# Patient Record
Sex: Female | Born: 1970 | Race: White | Hispanic: No | Marital: Single | Smoking: Never smoker
Health system: Southern US, Community
[De-identification: ages and names within clinical notes are randomized; demographics above are authoritative.]

## PROBLEM LIST (undated history)

## (undated) DIAGNOSIS — Z87442 Personal history of urinary calculi: Secondary | ICD-10-CM

## (undated) DIAGNOSIS — F329 Major depressive disorder, single episode, unspecified: Secondary | ICD-10-CM

## (undated) DIAGNOSIS — R0600 Dyspnea, unspecified: Secondary | ICD-10-CM

## (undated) DIAGNOSIS — K589 Irritable bowel syndrome without diarrhea: Secondary | ICD-10-CM

## (undated) DIAGNOSIS — F32A Depression, unspecified: Secondary | ICD-10-CM

## (undated) DIAGNOSIS — Z8719 Personal history of other diseases of the digestive system: Secondary | ICD-10-CM

## (undated) DIAGNOSIS — E7800 Pure hypercholesterolemia, unspecified: Secondary | ICD-10-CM

## (undated) DIAGNOSIS — D649 Anemia, unspecified: Secondary | ICD-10-CM

## (undated) DIAGNOSIS — K219 Gastro-esophageal reflux disease without esophagitis: Secondary | ICD-10-CM

## (undated) DIAGNOSIS — I10 Essential (primary) hypertension: Secondary | ICD-10-CM

## (undated) DIAGNOSIS — F419 Anxiety disorder, unspecified: Secondary | ICD-10-CM

## (undated) DIAGNOSIS — K52832 Lymphocytic colitis: Secondary | ICD-10-CM

## (undated) DIAGNOSIS — R519 Headache, unspecified: Secondary | ICD-10-CM

## (undated) HISTORY — PX: CARPAL TUNNEL RELEASE: SHX101

## (undated) HISTORY — PX: WISDOM TOOTH EXTRACTION: SHX21

## (undated) HISTORY — PX: COLONOSCOPY: SHX174

---
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
## 2003-08-29 HISTORY — PX: HEMORRHOID SURGERY: SHX153

## 2009-10-25 ENCOUNTER — Ambulatory Visit (HOSPITAL_BASED_OUTPATIENT_CLINIC_OR_DEPARTMENT_OTHER): Admission: RE | Admit: 2009-10-25 | Discharge: 2009-10-25 | Payer: Self-pay | Admitting: Orthopedic Surgery

## 2010-07-05 ENCOUNTER — Emergency Department: Payer: Self-pay | Admitting: Emergency Medicine

## 2010-11-18 LAB — BASIC METABOLIC PANEL
BUN: 10 mg/dL (ref 6–23)
CO2: 28 mEq/L (ref 19–32)
Calcium: 9.5 mg/dL (ref 8.4–10.5)
Chloride: 99 mEq/L (ref 96–112)
Creatinine, Ser: 0.93 mg/dL (ref 0.4–1.2)
GFR calc Af Amer: >60 mL/min (ref >60)
Glucose, Bld: 100 mg/dL — ABNORMAL HIGH (ref 70–99)

## 2011-10-10 ENCOUNTER — Ambulatory Visit: Payer: Self-pay

## 2012-11-20 ENCOUNTER — Ambulatory Visit: Payer: Self-pay

## 2014-02-23 ENCOUNTER — Ambulatory Visit: Payer: Self-pay

## 2015-06-09 ENCOUNTER — Other Ambulatory Visit: Payer: Self-pay

## 2015-06-09 DIAGNOSIS — Z1231 Encounter for screening mammogram for malignant neoplasm of breast: Secondary | ICD-10-CM

## 2015-06-16 ENCOUNTER — Ambulatory Visit
Admission: RE | Admit: 2015-06-16 | Discharge: 2015-06-16 | Disposition: A | Discharge status: Home / Self Care | Payer: 59 | Source: Ambulatory Visit

## 2015-06-16 DIAGNOSIS — Z1231 Encounter for screening mammogram for malignant neoplasm of breast: Secondary | ICD-10-CM

## 2015-12-02 ENCOUNTER — Other Ambulatory Visit: Payer: Self-pay | Admitting: Internal Medicine

## 2015-12-02 DIAGNOSIS — R1011 Right upper quadrant pain: Secondary | ICD-10-CM

## 2015-12-09 ENCOUNTER — Ambulatory Visit
Admission: RE | Admit: 2015-12-09 | Discharge: 2015-12-09 | Disposition: A | Discharge status: Home / Self Care | Payer: 59 | Source: Ambulatory Visit | Attending: Internal Medicine | Admitting: Internal Medicine

## 2015-12-09 DIAGNOSIS — R1011 Right upper quadrant pain: Secondary | ICD-10-CM | POA: Insufficient documentation

## 2015-12-09 DIAGNOSIS — N200 Calculus of kidney: Secondary | ICD-10-CM | POA: Diagnosis not present

## 2016-06-19 ENCOUNTER — Other Ambulatory Visit: Payer: Self-pay | Admitting: Internal Medicine

## 2016-06-19 DIAGNOSIS — Z1231 Encounter for screening mammogram for malignant neoplasm of breast: Secondary | ICD-10-CM

## 2016-07-07 ENCOUNTER — Ambulatory Visit
Admission: RE | Admit: 2016-07-07 | Discharge: 2016-07-07 | Disposition: A | Discharge status: Home / Self Care | Payer: 59 | Source: Ambulatory Visit | Attending: Internal Medicine | Admitting: Internal Medicine

## 2016-07-07 DIAGNOSIS — Z1231 Encounter for screening mammogram for malignant neoplasm of breast: Secondary | ICD-10-CM | POA: Insufficient documentation

## 2017-06-03 ENCOUNTER — Emergency Department: Payer: 59

## 2017-06-03 ENCOUNTER — Encounter: Payer: Self-pay | Admitting: Radiology

## 2017-06-03 ENCOUNTER — Emergency Department
Admission: EM | Admit: 2017-06-03 | Discharge: 2017-06-03 | Disposition: A | Discharge status: Home / Self Care | Payer: 59 | Attending: Emergency Medicine | Admitting: Emergency Medicine

## 2017-06-03 DIAGNOSIS — Z79899 Other long term (current) drug therapy: Secondary | ICD-10-CM | POA: Diagnosis not present

## 2017-06-03 DIAGNOSIS — Z885 Allergy status to narcotic agent status: Secondary | ICD-10-CM | POA: Insufficient documentation

## 2017-06-03 DIAGNOSIS — Z880 Allergy status to penicillin: Secondary | ICD-10-CM | POA: Diagnosis not present

## 2017-06-03 DIAGNOSIS — R0781 Pleurodynia: Secondary | ICD-10-CM

## 2017-06-03 DIAGNOSIS — E876 Hypokalemia: Secondary | ICD-10-CM | POA: Insufficient documentation

## 2017-06-03 LAB — CBC WITH DIFFERENTIAL/PLATELET
Basophils Absolute: 0.1 10*3/uL (ref 0–0.1)
Basophils Relative: 1 %
Eosinophils Absolute: 0.3 10*3/uL (ref 0–0.7)
Eosinophils Relative: 3 %
HCT: 40.5 % (ref 35.0–47.0)
Hemoglobin: 13.5 g/dL (ref 12.0–16.0)
Lymphocytes Relative: 25 %
Lymphs Abs: 2.2 10*3/uL (ref 1.0–3.6)
MCH: 25.9 pg — ABNORMAL LOW (ref 26.0–34.0)
MCHC: 33.4 g/dL (ref 32.0–36.0)
MCV: 77.5 fL — ABNORMAL LOW (ref 80.0–100.0)
Monocytes Absolute: 0.5 10*3/uL (ref 0.2–0.9)
Monocytes Relative: 6 %
Neutro Abs: 5.7 10*3/uL (ref 1.4–6.5)
Neutrophils Relative %: 65 %
Platelets: 430 10*3/uL (ref 150–440)
RBC: 5.23 MIL/uL — ABNORMAL HIGH (ref 3.80–5.20)
RDW: 16.3 % — ABNORMAL HIGH (ref 11.5–14.5)
WBC: 8.8 10*3/uL (ref 3.6–11.0)

## 2017-06-03 LAB — COMPREHENSIVE METABOLIC PANEL
ALK PHOS: 65 U/L (ref 38–126)
ALT: 21 U/L (ref 14–54)
ANION GAP: 7 (ref 5–15)
AST: 19 U/L (ref 15–41)
Albumin: 3.8 g/dL (ref 3.5–5.0)
BUN: 14 mg/dL (ref 6–20)
CALCIUM: 8.9 mg/dL (ref 8.9–10.3)
CO2: 29 mmol/L (ref 22–32)
Chloride: 100 mmol/L — ABNORMAL LOW (ref 101–111)
Creatinine, Ser: 0.79 mg/dL (ref 0.44–1.00)
GFR calc non Af Amer: >60 mL/min (ref >60)
GLUCOSE: 120 mg/dL — AB (ref 65–99)
POTASSIUM: 2.9 mmol/L — AB (ref 3.5–5.1)
Sodium: 136 mmol/L (ref 135–145)
TOTAL PROTEIN: 7.7 g/dL (ref 6.5–8.1)
Total Bilirubin: 0.7 mg/dL (ref 0.3–1.2)

## 2017-06-03 LAB — TROPONIN I

## 2017-06-03 MED ORDER — IOPAMIDOL (ISOVUE-370) INJECTION 76%
75.0000 mL | Freq: Once | INTRAVENOUS | Status: AC | PRN
  Administered 2017-06-03: 75 mL via INTRAVENOUS

## 2017-06-03 MED ORDER — POTASSIUM CHLORIDE CRYS ER 20 MEQ PO TBCR
40.0000 meq | EXTENDED_RELEASE_TABLET | Freq: Once | ORAL | Status: AC
  Administered 2017-06-03: 40 meq via ORAL
  Filled 2017-06-03: qty 2

## 2017-06-03 NOTE — Discharge Instructions (Signed)
You evaluated for right-sided chest pain, and although no certain cause was found, your exam and evaluation are overall reassuring in the emergency department today.  Return to the emergency department immediately for any new or worsening pain, dizziness, passing out, nausea, sweats, or any other symptoms concerning to you.

## 2017-06-03 NOTE — ED Provider Notes (Signed)
[facility] Emergency Department Provider Note ____________________________________________   I have reviewed the triage vital signs and the triage nursing note.  HISTORY  Chief Complaint Abdominal Pain   Historian Patient  HPI Natalie Strickland is a 46 y.o. female taking omeprazole for gerd presents with right sided pleuritic chest pain for several days.  Some mild shortness of breath.  No fevers or hemoptysis.  Never had this before.  No focal leg swelling, occasionally has bila pitting edema.  Pos for family history of blood clots - no personal history of such.  No central chest discomfort.  A week or so ago awoke with gerd into mouth and was coughing, thought she may have aspirated and developed a pneumonia.  Pain 5/10 currently - declines pain medication now.  Overnight at times severe pain.  Denies abdominal source of pain.    History reviewed. No pertinent past medical history.  There are no active problems to display for this patient.   No past surgical history on file.  Prior to Admission medications   Medication Sig Start Date End Date Taking? Authorizing Provider  busPIRone (BUSPAR) 30 MG tablet Take 15 mg by mouth 2 (two) times daily. 12/19/16  Yes [provider]  furosemide (LASIX) 20 MG tablet Take 20-40 mg by mouth 2 (two) times daily. Alternate 20 and 40 mg daily. Take 20 mg on MWF. Take 40 mg on Sun, Tues, Thurs and Sat. 04/19/17  Yes [provider]  hydrochlorothiazide (HYDRODIURIL) 25 MG tablet Take 25 mg by mouth 2 (two) times daily. 11/01/16  Yes [provider]  omeprazole (PRILOSEC) 40 MG capsule Take 20 mg by mouth daily. 09/01/16 09/01/17 Yes [provider]  sertraline (ZOLOFT) 50 MG tablet Take 25 mg by mouth 2 (two) times daily. 11/23/16  Yes [provider]  simvastatin (ZOCOR) 20 MG tablet Take 20 mg by mouth at bedtime. 02/20/17 02/20/18 Yes [provider]  VITAMIN A PO Take 1  tablet by mouth daily.   Yes [provider]    Allergies  Allergen Reactions  . Codeine Rash  . Penicillin G Hives    Has patient had a PCN reaction causing immediate rash, facial/tongue/throat swelling, SOB or lightheadedness with hypotension: No Has patient had a PCN reaction causing severe rash involving mucus membranes or skin necrosis: No Has patient had a PCN reaction that required hospitalization: No Has patient had a PCN reaction occurring within the last 10 years: No If all of the above answers are "NO", then may proceed with Cephalosporin use.     Family History  Problem Relation Age of Onset  . Breast cancer Mother 39       x 2 times  . Ovarian cancer Mother   . Breast cancer Maternal Aunt   . Ovarian cancer Maternal Aunt   . Breast cancer Maternal Aunt     Social History Social History  Substance Use Topics  . Smoking status: Not on file  . Smokeless tobacco: Not on file  . Alcohol use Not on file    Review of Systems  Constitutional: Negative for fever. Eyes: Negative for visual changes. ENT: Negative for sore throat. Cardiovascular: Negative for palpitations. Respiratory: Positive for shortness of breath. Gastrointestinal: Negative for abdominal pain, vomiting and diarrhea. Genitourinary: Negative for dysuria. Musculoskeletal: Negative for back pain. Skin: Negative for rash. Neurological: Negative for headache.  ____________________________________________   PHYSICAL EXAM:  VITAL SIGNS: ED Triage Vitals  Enc Vitals Group  BP 06/03/17 0856 (!) 153/73     Pulse Rate 06/03/17 0856 80     Resp 06/03/17 0856 18     Temp 06/03/17 0856 98.1 F (36.7 C)     Temp Source 06/03/17 0856 Oral     SpO2 06/03/17 0856 97 %     Weight 06/03/17 0857 190 lb (86.2 kg)     Height 06/03/17 0857 5\' 5"  (1.651 m)     Head Circumference --      Peak Flow --      Pain Score 06/03/17 0853 5     Pain Loc --      Pain Edu? --      Excl. in [place]? --       Constitutional: Alert and oriented. Well appearing and in no distress. HEENT   Head: Normocephalic and atraumatic.      Eyes: Conjunctivae are normal. Pupils equal and round.       Ears:         Nose: No congestion/rhinnorhea.   Mouth/Throat: Mucous membranes are moist.   Neck: No stridor. Cardiovascular/Chest: Normal rate, regular rhythm.  No murmurs, rubs, or gallops. Respiratory: Normal respiratory effort without tachypnea nor retractions. Breath sounds are clear and equal bilaterally. No wheezes/rales/rhonchi. Gastrointestinal: Soft. No distention, no guarding, no rebound. Nontender.  No ruq tenderness.  Genitourinary/rectal:Deferred Musculoskeletal: Nontender with normal range of motion in all extremities. No joint effusions.  No lower extremity tenderness.  No edema. Neurologic:  Normal speech and language. No gross or focal neurologic deficits are appreciated. Skin:  Skin is warm, dry and intact. No rash noted. Psychiatric: Mood and affect are normal. Speech and behavior are normal. Patient exhibits appropriate insight and judgment.   ____________________________________________  LABS (pertinent positives/negatives) I, Lisa Roca, MD the attending physician have reviewed the labs noted below.  Labs Reviewed  COMPREHENSIVE METABOLIC PANEL - Abnormal; Notable for the following:       Result Value   Potassium 2.9 (*)    Chloride 100 (*)    Glucose, Bld 120 (*)    All other components within normal limits  CBC WITH DIFFERENTIAL/PLATELET - Abnormal; Notable for the following:    RBC 5.23 (*)    MCV 77.5 (*)    MCH 25.9 (*)    RDW 16.3 (*)    All other components within normal limits  TROPONIN I    ____________________________________________    EKG I, Lisa Roca, MD, the attending physician have personally viewed and interpreted all ECGs.  70 bpm. Normal sinus rhythm. Narrow QRS. Normal axis. Normal ST and T-wave. She does have S1, every 3,  flattened T-wave but not inverted. ____________________________________________  RADIOLOGY All Xrays were viewed by me.  Imaging interpreted by Radiologist, and I, Lisa Roca, MD the attending physician have reviewed the radiologist interpretation noted below.  Chest xray 2 view:  IMPRESSION: No active cardiopulmonary disease.  CT angio chest for pe:  IMPRESSION: 1. No evidence of a pulmonary embolism. 2. No acute findings. No findings to account for right lower chest pain. 3. Minor lower lobe subsegmental atelectasis.  __________________________________________  PROCEDURES  Procedure(s) performed: None  Critical Care performed: None  ____________________________________________  No current facility-administered medications on file prior to encounter.    No current outpatient prescriptions on file prior to encounter.    ____________________________________________  ED COURSE / ASSESSMENT AND PLAN  Pertinent labs & imaging results that were available during my care of the patient were reviewed by me and considered in  my medical decision making (see chart for details).   Right pleuritic chest pain, will eval for emergency causes. No skin rash.  No other symptoms that seem like ACS.  No abdominal pain, seems unlikely abdominal source such as biliary colic.  Will start with CXr, and if negative for source we discussed risk vs benefit of pursuing chest CT to r/o PE especially given family history.  CT chest reassuring overall.  Uncertain cause for symptoms, but reassuring evaluation.  Does not seem to be abdominal = no abd pain on history or exam and reassuring labs.  DIFFERENTIAL DIAGNOSIS: Differential includes, but is not limited to, viral syndrome, bronchitis including COPD exacerbation, pneumonia, reactive airway disease including asthma, CHF including exacerbation with or without pulmonary/interstitial edema, pneumothorax, ACS, thoracic trauma, and pulmonary  embolism.  CONSULTATIONS:   None   Patient / Family / Caregiver informed of clinical course, medical decision-making process, and agree with plan.   I discussed return precautions, follow-up instructions, and discharge instructions with patient and/or family.  Discharge Instructions : You evaluated for right-sided chest pain, and although no certain cause was found, your exam and evaluation are overall reassuring in the emergency department today.  Return to the emergency department immediately for any new or worsening pain, dizziness, passing out, nausea, sweats, or any other symptoms concerning to you.  ___________________________________________   FINAL CLINICAL IMPRESSION(S) / ED DIAGNOSES   Final diagnoses:  Hypokalemia  Pleuritic chest pain              Note: This dictation was prepared with Dragon dictation. Any transcriptional errors that result from this process are unintentional    Lisa Roca, MD 06/03/17 1204

## 2017-06-03 NOTE — ED Triage Notes (Signed)
Patient states that she has significant reflux disease. States that she woke from sleep with gastric contents in her mouth and nose. Since then, she has developed RUQ pain with difficulty taking a deep breath. States that "i think it got in my lungs and I may have pneumonia"  Symptoms for 2 weeks

## 2017-06-06 ENCOUNTER — Other Ambulatory Visit: Payer: Self-pay | Admitting: Internal Medicine

## 2017-06-06 DIAGNOSIS — Z1231 Encounter for screening mammogram for malignant neoplasm of breast: Secondary | ICD-10-CM

## 2017-07-09 ENCOUNTER — Ambulatory Visit
Admission: RE | Admit: 2017-07-09 | Discharge: 2017-07-09 | Disposition: A | Discharge status: Home / Self Care | Payer: 59 | Source: Ambulatory Visit | Attending: Internal Medicine | Admitting: Internal Medicine

## 2017-07-09 DIAGNOSIS — Z1231 Encounter for screening mammogram for malignant neoplasm of breast: Secondary | ICD-10-CM | POA: Diagnosis present

## 2017-09-26 ENCOUNTER — Other Ambulatory Visit: Payer: Self-pay | Admitting: Internal Medicine

## 2017-09-26 DIAGNOSIS — M7989 Other specified soft tissue disorders: Secondary | ICD-10-CM

## 2017-10-01 ENCOUNTER — Ambulatory Visit
Admission: RE | Admit: 2017-10-01 | Discharge: 2017-10-01 | Disposition: A | Discharge status: Home / Self Care | Payer: Managed Care, Other (non HMO) | Source: Ambulatory Visit | Attending: Internal Medicine | Admitting: Internal Medicine

## 2017-10-01 DIAGNOSIS — M7989 Other specified soft tissue disorders: Secondary | ICD-10-CM | POA: Diagnosis not present

## 2018-07-30 ENCOUNTER — Other Ambulatory Visit: Payer: Self-pay | Admitting: Internal Medicine

## 2018-07-30 DIAGNOSIS — Z1231 Encounter for screening mammogram for malignant neoplasm of breast: Secondary | ICD-10-CM

## 2018-09-17 ENCOUNTER — Ambulatory Visit
Admission: RE | Admit: 2018-09-17 | Discharge: 2018-09-17 | Disposition: A | Discharge status: Home / Self Care | Payer: Managed Care, Other (non HMO) | Source: Ambulatory Visit | Attending: Internal Medicine | Admitting: Internal Medicine

## 2018-09-17 DIAGNOSIS — Z1231 Encounter for screening mammogram for malignant neoplasm of breast: Secondary | ICD-10-CM | POA: Insufficient documentation

## 2019-03-13 ENCOUNTER — Other Ambulatory Visit: Payer: Self-pay | Admitting: Gastroenterology

## 2019-03-13 ENCOUNTER — Other Ambulatory Visit
Admission: RE | Admit: 2019-03-13 | Discharge: 2019-03-13 | Disposition: A | Discharge status: Home / Self Care | Payer: Managed Care, Other (non HMO) | Source: Ambulatory Visit | Attending: Gastroenterology | Admitting: Gastroenterology

## 2019-03-13 DIAGNOSIS — K30 Functional dyspepsia: Secondary | ICD-10-CM

## 2019-03-13 DIAGNOSIS — K219 Gastro-esophageal reflux disease without esophagitis: Secondary | ICD-10-CM

## 2019-03-13 DIAGNOSIS — K52832 Lymphocytic colitis: Secondary | ICD-10-CM | POA: Insufficient documentation

## 2019-03-13 DIAGNOSIS — R1013 Epigastric pain: Secondary | ICD-10-CM

## 2019-03-19 ENCOUNTER — Other Ambulatory Visit: Payer: Self-pay

## 2019-03-19 ENCOUNTER — Ambulatory Visit
Admission: RE | Admit: 2019-03-19 | Discharge: 2019-03-19 | Disposition: A | Discharge status: Home / Self Care | Payer: Managed Care, Other (non HMO) | Source: Ambulatory Visit | Attending: Gastroenterology | Admitting: Gastroenterology

## 2019-03-19 DIAGNOSIS — R1013 Epigastric pain: Secondary | ICD-10-CM | POA: Insufficient documentation

## 2019-03-19 DIAGNOSIS — K30 Functional dyspepsia: Secondary | ICD-10-CM | POA: Diagnosis present

## 2019-03-19 DIAGNOSIS — K219 Gastro-esophageal reflux disease without esophagitis: Secondary | ICD-10-CM

## 2019-03-19 LAB — CALPROTECTIN, FECAL: Calprotectin, Fecal: 81 ug/g (ref 0–120)

## 2019-04-04 ENCOUNTER — Other Ambulatory Visit: Payer: Self-pay | Admitting: Gastroenterology

## 2019-04-04 DIAGNOSIS — R1013 Epigastric pain: Secondary | ICD-10-CM

## 2019-04-11 ENCOUNTER — Other Ambulatory Visit: Payer: Self-pay

## 2019-04-11 ENCOUNTER — Ambulatory Visit
Admission: RE | Admit: 2019-04-11 | Discharge: 2019-04-11 | Disposition: A | Discharge status: Home / Self Care | Payer: Managed Care, Other (non HMO) | Source: Ambulatory Visit | Attending: Gastroenterology | Admitting: Gastroenterology

## 2019-04-11 DIAGNOSIS — R1013 Epigastric pain: Secondary | ICD-10-CM | POA: Insufficient documentation

## 2019-08-28 ENCOUNTER — Ambulatory Visit: Payer: Managed Care, Other (non HMO) | Attending: Internal Medicine

## 2019-08-28 DIAGNOSIS — Z20822 Contact with and (suspected) exposure to covid-19: Secondary | ICD-10-CM

## 2019-09-03 ENCOUNTER — Ambulatory Visit: Payer: Managed Care, Other (non HMO) | Attending: Internal Medicine

## 2019-09-03 DIAGNOSIS — Z20822 Contact with and (suspected) exposure to covid-19: Secondary | ICD-10-CM

## 2019-09-03 LAB — NOVEL CORONAVIRUS, NAA

## 2019-09-05 LAB — NOVEL CORONAVIRUS, NAA: SARS-CoV-2, NAA: NOT DETECTED

## 2019-09-16 ENCOUNTER — Other Ambulatory Visit: Payer: Self-pay | Admitting: Internal Medicine

## 2019-09-16 DIAGNOSIS — Z1231 Encounter for screening mammogram for malignant neoplasm of breast: Secondary | ICD-10-CM

## 2019-10-06 ENCOUNTER — Ambulatory Visit
Admission: RE | Admit: 2019-10-06 | Discharge: 2019-10-06 | Disposition: A | Discharge status: Home / Self Care | Payer: Managed Care, Other (non HMO) | Source: Ambulatory Visit | Attending: Internal Medicine | Admitting: Internal Medicine

## 2019-10-06 DIAGNOSIS — Z1231 Encounter for screening mammogram for malignant neoplasm of breast: Secondary | ICD-10-CM | POA: Insufficient documentation

## 2019-11-13 ENCOUNTER — Other Ambulatory Visit: Payer: Self-pay | Admitting: Obstetrics and Gynecology

## 2019-12-03 NOTE — H&P (Signed)
H&P will not copy - will need to be addressed at surgical visit

## 2019-12-06 NOTE — H&P (Signed)
History of Present Illness: Patient is an established patient who returns for a pre-operative/sign consents visit for a TLH With Right Salpingo Oophorectomy. Indications are her menorrhagia with irregular cycle, uterine fibroids, secondary anemia, and dermoid cyst of right ovary. CA125 within normal limits.  Work Up: Pap smear: 10/2015 neg/neg EMBx 09/2019: negative for hyperplasia and/or malignancy SIS, [place] 09/2019: Fibroid ut Ut=9.85 x 6.08 x 6.86 cm 1) post to endometrium= 19 mm 2) rt lat= 22 mm 3) lt fundal=26 mm Endometrium=12.83 mm Lt ov wnl Rt ov seen with complex mass Probable dermoid= avg 3.14 cm 3.46 x 3.01 x 2.96 cm  Pertinent Surgical Hx: -BTL  Past Medical History: has a past medical history of Anxiety, Hypertension, IBS (irritable bowel syndrome), and Lymphocytic colitis (11/17/2015).  Past Surgical History: has a past surgical history that includes Hemorrhoidectomy External; Tubal ligation; Colonoscopy (11/17/2015); other surgery; and egd (05/29/2019). Family History: family history includes Arthritis in her father; Breast cancer in her mother; Cancer in her father; Colon cancer in her maternal aunt and maternal grandfather; Colon polyps in her mother; Diabetes type II in her brother and father; GI problems in her mother; Heart disease in her father and mother; High blood pressure (Hypertension) in her brother, father, and mother; Hyperlipidemia (Elevated cholesterol) in her father and mother; Liver cancer in her maternal grandmother; Migraines in her mother; Myocardial Infarction (Heart attack) in her brother, father, and mother; Ovarian cancer in her mother; Thyroid disease in her brother and mother. Social History: reports that she has never smoked. She has never used smokeless tobacco. She reports current alcohol use. She reports that she does not use drugs. OB/GYN History:  OB History  Gravida  2  Para  2  Term  2  Preterm   AB   Living  2   SAB   TAB   Ectopic    Molar   Multiple   Live Births      Allergies: is allergic to codeine; nsaids (non-steroidal anti-inflammatory drug); and penicillin. Medications: Current Outpatient Medications:  . biotin 1,000 mcg Chew, Take by mouth., Disp: , Rfl:  . busPIRone (BUSPAR) 15 MG tablet, Take 1 tablet (15 mg total) by mouth 2 (two) times daily, Disp: 180 tablet, Rfl: 3 . cyanocobalamin (VITAMIN B12) 1000 MCG tablet, Take 1,000 mcg by mouth once daily, Disp: , Rfl:  . ferrous sulfate 325 (65 FE) MG tablet, Take 325 mg by mouth daily with breakfast, Disp: , Rfl:  . FUROsemide (LASIX) 20 MG tablet, Take 1 tablet by mouth once daily, Disp: 90 tablet, Rfl: 0 . hydroCHLOROthiazide (HYDRODIURIL) 25 MG tablet, Take 1 tablet by mouth twice daily, Disp: 60 tablet, Rfl: 0 . levocetirizine (XYZAL) 5 MG tablet, Take 1 tablet (5 mg total) by mouth every evening, Disp: 90 tablet, Rfl: 3 . lisinopriL (ZESTRIL) 20 MG tablet, Take 1 tablet by mouth once daily, Disp: 90 tablet, Rfl: 0 . omeprazole (PRILOSEC) 40 MG DR capsule, TAKE 1 CAPSULE BY MOUTH TWICE DAILY 15 TO 20 MINUTES BEFORE MEALS, Disp: 60 capsule, Rfl: 6 . PLANT STANOL ESTER (CHOLEST OFF ORAL), Take by mouth., Disp: , Rfl:  . potassium gluconate 595 mg (99 mg) Tab, Take 1 tablet by mouth once daily. , Disp: , Rfl:  . sertraline (ZOLOFT) 50 MG tablet, Take 1 tablet (50 mg total) by mouth once daily, Disp: 30 tablet, Rfl: 11 . simvastatin (ZOCOR) 20 MG tablet, Take 1 tablet (20 mg total) by mouth nightly, Disp: 90 tablet, Rfl: 3  Exam:  BP 138/82  Ht 165.1 cm (5\' 5" )  Wt 98.4 kg (217 lb)  BMI 36.11 kg/m   Constitutional: General appearance: Well nourished, well developed female in no acute distress.  Neuro/psych: Normal mood and affect. No gross motor deficits. Neck: Supple, normal appearance.  Respiratory: Normal respiratory effort, no use of accessory muscles Skin: No visible rashes or external lesions  Impression:   The primary encounter  diagnosis was Pre-operative clearance. Diagnoses of Menorrhagia with irregular cycle, Submucous leiomyoma of uterus, and Dermoid cyst of ovary, right were also pertinent to this visit.  Plan:   1. Pre-Operative Discussion Patient returns for a preoperative discussion regarding her plans to proceed with surgical treatment of her Menorrhagia with Irregular Cycle, Uterine Fibroids, Secondary Anemia, Dermoid cyst of Right Ovary by total laparoscopic hysterectomy with Right Salpingo Oophorectomy procedure. We may perform a cystoscopy to evaluate the urinary tract after the procedure, if surgically indicated for uro tract integrity.   The patient and I discussed the technical aspects of the procedure including the potential for risks and complications. These include but are not limited to the risk of infection requiring post-operative antibiotics or further procedures. We talked about the risk of injury to adjacent organs including bladder, bowel, ureter, blood vessels or nerves. We talked about the need to convert to an open incision. We talked about the possible need for blood transfusion. We talked about postop complications such as thromboembolic or cardiopulmonary complications. All of her questions were answered.   Hx of anemia, most recent H/H 08/2019 12.7/40.4. On oral iron  Specific Peri-operative Considerations:  - Consent: obtained today - Health Maintenance: will get CA125. No MRI because will be doing an oophorectomy, not just a cystectomy - Labs: CBC, CMP preoperatively - Studies: EKG, CXR preoperatively - Bowel Preparation: None required - Abx: Ancef 2g - VTE ppx: SCDs perioperatively - Glucose Protocol: None - Beta-blockade: n/a - Will give preop Lovenox  Diagnoses and all orders for this visit:  Pre-operative clearance  Menorrhagia with irregular cycle  Submucous leiomyoma of uterus  Dermoid cyst of ovary, right  Return for 2 week Post-Op.

## 2019-12-09 ENCOUNTER — Encounter
Admission: RE | Admit: 2019-12-09 | Discharge: 2019-12-09 | Disposition: A | Discharge status: Home / Self Care | Payer: Managed Care, Other (non HMO) | Source: Ambulatory Visit | Attending: Obstetrics and Gynecology | Admitting: Obstetrics and Gynecology

## 2019-12-09 ENCOUNTER — Other Ambulatory Visit: Payer: Self-pay

## 2019-12-09 DIAGNOSIS — Z01812 Encounter for preprocedural laboratory examination: Secondary | ICD-10-CM | POA: Diagnosis not present

## 2019-12-09 HISTORY — DX: Irritable bowel syndrome, unspecified: K58.9

## 2019-12-09 HISTORY — DX: Pure hypercholesterolemia, unspecified: E78.00

## 2019-12-09 HISTORY — DX: Headache, unspecified: R51.9

## 2019-12-09 HISTORY — DX: Anemia, unspecified: D64.9

## 2019-12-09 HISTORY — DX: Gastro-esophageal reflux disease without esophagitis: K21.9

## 2019-12-09 HISTORY — DX: Dyspnea, unspecified: R06.00

## 2019-12-09 HISTORY — DX: Essential (primary) hypertension: I10

## 2019-12-09 HISTORY — DX: Personal history of urinary calculi: Z87.442

## 2019-12-09 HISTORY — DX: Depression, unspecified: F32.A

## 2019-12-09 HISTORY — DX: Lymphocytic colitis: K52.832

## 2019-12-09 HISTORY — DX: Anxiety disorder, unspecified: F41.9

## 2019-12-09 HISTORY — DX: Personal history of other diseases of the digestive system: Z87.19

## 2019-12-09 NOTE — Patient Instructions (Addendum)
INSTRUCTIONS FOR SURGERY     Your surgery is scheduled for:   Monday, April 19TH     To find out your arrival time for the day of surgery,          please call [phone] between 1 pm and 3 pm on :  Friday, April 16TH     When you arrive for surgery, report to the [facility].       Do NOT stop on the first floor to register.    REMEMBER: Instructions that are not followed completely may result in serious medical risk,  up to and including death, or upon the discretion of your surgeon and anesthesiologist,            your surgery may need to be rescheduled.  __X__ 1. Do not eat food after midnight the night before your procedure.                    No gum, candy, lozenger, tic tacs, tums or hard candies.                  ABSOLUTELY NOTHING SOLID IN YOUR MOUTH AFTER MIDNIGHT                   You may drink unlimited clear liquids up to 2 hours before you are scheduled to arrive for surgery.                   Do not drink anything within those 2 hours unless you need to take medicine, then take the                   smallest amount you need.  Clear liquids include:  water, apple juice without pulp,                   any flavor Gatorade, Black coffee, black tea.  Sugar may be added but no dairy/ honey /lemon.                        Broth and jello is not considered a clear liquid.  __x__  2. On the morning of surgery, please brush your teeth with toothpaste and water. You may rinse with                  mouthwash if you wish but DO NOT SWALLOW TOOTHPASTE OR MOUTHWASH  __X___3. NO alcohol for 24 hours before or after surgery.  __x___ 4.  Do NOT smoke or use e-cigarettes for 24 HOURS PRIOR TO SURGERY.                      DO NOT Use any chewable tobacco products for at least 6 hours prior to surgery.  __x___ 5. If you start any new medication after this appointment and prior to surgery, please  Bring it with you on the day of surgery.  ___x__ 6. Notify your doctor if there is any change in your medical condition, such as fever,  infection, vomitting, diarrhea or any open sores.  __x___ 7.  USE the CHG SOAP as instructed, the night before surgery and the day of surgery.                   Once you have washed with this soap, do NOT use any of the following: Powders, perfumes                    or lotions. Please do not wear make up, hairpins, clips or nail polish. You MAY wear deodorant.                   Men may shave their face and neck.  Women need to shave 48 hours prior to surgery.                   DO NOT wear ANY jewelry on the day of surgery. If there are rings that are too tight to                    remove easily, please address this prior to the surgery day. Piercings need to be removed.                                                                     NO METAL ON YOUR BODY.                    Do NOT bring any valuables.  If you came to Pre-Admit testing then you will not need license,                     insurance card or credit card.  If you will be staying overnight, please either leave your things in                     the car or have your family be responsible for these items.                     [facility] IS NOT RESPONSIBLE FOR BELONGINGS OR VALUABLES.  ___X__ 8. DO NOT wear contact lenses on surgery day.  You may not have dentures,                     Hearing aides, contacts or glasses in the operating room. These items can be                    Placed in the Recovery Room to receive immediately after surgery.  __x___ 9. IF YOU ARE SCHEDULED TO GO HOME ON THE SAME DAY, YOU MUST                   Have someone to drive you home and to stay with you  for the first 24 hours.                    Have an arrangement prior to arriving on surgery day.  ___x__ 10. Take the following medications on the morning of surgery with a sip of water:  1.  BUSPAR                     2.  PRILOSEC                     3.                                 __X___ 11.  Follow any instructions provided to you by your surgeon.                        Such as enema, clear liquid bowel prep                       ##PLEASE CONSUME THE PRESURGICAL DRINK ON THE MORNING                         OF SURGERY.  HAVE IT COMPLETED BY 2 HOURS PRIOR TO                          ARRIVAL TO THE HOSPITAL.##  __X__  12. STOP ALL ASPIRIN PRODUCTS AS OF TODAY, MARCH 13TH                       THIS INCLUDES BC POWDERS / GOODIES POWDER  __x___ 13. STOP Anti-inflammatories as of: TODAY, MARCH 13TH                      This includes IBUPROFEN / MOTRIN / ADVIL / ALEVE/ NAPROXYN                    YOU MAY TAKE TYLENOL ANY TIME PRIOR TO SURGERY.  __X___ 52.  You may continue taking Vitamin B12 / Vitamin D3 but do not take on the morning of surgery.  ___X___17.  Continue to take the following medications but do not take on the morning of surgery:                          LASIX // HYDROCHLOROTHIAZIDE // POTASSIUM  ___X___18. Wear clean and comfortable clothing to the hospital.                    [place].  PLEASE BRING PHONE NUMBERS OF YOUR CONTACTS.  HAVE STOOL SOFTENERS FOR USE AFTER SURGERY. IT IS RECOMMENDED TO START    TAKING THESE A DAY OR 2 PRIOR TO YOUR SURGERY.  HAVE A FIRM PILLOW AVAILABLE TO USE FOR SPLINTING YOUR ABDOMEN AFTER SURGERY.

## 2019-12-11 ENCOUNTER — Other Ambulatory Visit
Admission: RE | Admit: 2019-12-11 | Discharge: 2019-12-11 | Disposition: A | Discharge status: Home / Self Care | Payer: Managed Care, Other (non HMO) | Source: Ambulatory Visit | Attending: Obstetrics and Gynecology | Admitting: Obstetrics and Gynecology

## 2019-12-11 ENCOUNTER — Other Ambulatory Visit: Payer: Self-pay

## 2019-12-11 DIAGNOSIS — Z20822 Contact with and (suspected) exposure to covid-19: Secondary | ICD-10-CM | POA: Insufficient documentation

## 2019-12-11 DIAGNOSIS — Z01812 Encounter for preprocedural laboratory examination: Secondary | ICD-10-CM | POA: Insufficient documentation

## 2019-12-11 DIAGNOSIS — I10 Essential (primary) hypertension: Secondary | ICD-10-CM

## 2019-12-11 LAB — BASIC METABOLIC PANEL
Anion gap: 11 (ref 5–15)
BUN: 17 mg/dL (ref 6–20)
CO2: 28 mmol/L (ref 22–32)
Calcium: 9 mg/dL (ref 8.9–10.3)
Chloride: 98 mmol/L (ref 98–111)
Creatinine, Ser: 0.92 mg/dL (ref 0.44–1.00)
GFR calc Af Amer: >60 mL/min (ref >60)
GFR calc non Af Amer: >60 mL/min (ref >60)
Glucose, Bld: 114 mg/dL — ABNORMAL HIGH (ref 70–99)
Potassium: 3.1 mmol/L — ABNORMAL LOW (ref 3.5–5.1)
Sodium: 137 mmol/L (ref 135–145)

## 2019-12-11 LAB — TYPE AND SCREEN
ABO/RH(D): O POS
Antibody Screen: NEGATIVE

## 2019-12-11 LAB — CBC
HCT: 38 % (ref 36.0–46.0)
Hemoglobin: 12.2 g/dL (ref 12.0–15.0)
MCH: 24.8 pg — ABNORMAL LOW (ref 26.0–34.0)
MCHC: 32.1 g/dL (ref 30.0–36.0)
MCV: 77.2 fL — ABNORMAL LOW (ref 80.0–100.0)
Platelets: 414 10*3/uL — ABNORMAL HIGH (ref 150–400)
RBC: 4.92 MIL/uL (ref 3.87–5.11)
RDW: 18.6 % — ABNORMAL HIGH (ref 11.5–15.5)
WBC: 9.5 10*3/uL (ref 4.0–10.5)
nRBC: 0 % (ref 0.0–0.2)

## 2019-12-11 LAB — SARS CORONAVIRUS 2 (TAT 6-24 HRS): SARS Coronavirus 2: NEGATIVE

## 2019-12-14 MED ORDER — CLINDAMYCIN PHOSPHATE 900 MG/50ML IV SOLN
900.0000 mg | INTRAVENOUS | Status: AC
  Administered 2019-12-15: 900 mg via INTRAVENOUS

## 2019-12-14 MED ORDER — GENTAMICIN SULFATE 40 MG/ML IJ SOLN
400.0000 mg | INTRAVENOUS | Status: AC
  Administered 2019-12-15: 400 mg via INTRAVENOUS
  Filled 2019-12-14: qty 10

## 2019-12-15 ENCOUNTER — Encounter
Admission: RE | Disposition: A | Discharge status: Home / Self Care | Payer: Self-pay | Source: Home / Self Care | Attending: Obstetrics and Gynecology

## 2019-12-15 ENCOUNTER — Ambulatory Visit: Payer: Managed Care, Other (non HMO) | Admitting: Registered Nurse

## 2019-12-15 ENCOUNTER — Ambulatory Visit
Admission: RE | Admit: 2019-12-15 | Discharge: 2019-12-15 | Disposition: A | Discharge status: Home / Self Care | Payer: Managed Care, Other (non HMO) | Attending: Obstetrics and Gynecology | Admitting: Obstetrics and Gynecology

## 2019-12-15 ENCOUNTER — Other Ambulatory Visit: Payer: Self-pay

## 2019-12-15 ENCOUNTER — Encounter: Payer: Self-pay | Admitting: Obstetrics and Gynecology

## 2019-12-15 DIAGNOSIS — N8301 Follicular cyst of right ovary: Secondary | ICD-10-CM | POA: Diagnosis not present

## 2019-12-15 DIAGNOSIS — D252 Subserosal leiomyoma of uterus: Secondary | ICD-10-CM | POA: Diagnosis not present

## 2019-12-15 DIAGNOSIS — N921 Excessive and frequent menstruation with irregular cycle: Secondary | ICD-10-CM | POA: Diagnosis present

## 2019-12-15 DIAGNOSIS — F419 Anxiety disorder, unspecified: Secondary | ICD-10-CM | POA: Diagnosis not present

## 2019-12-15 DIAGNOSIS — K589 Irritable bowel syndrome without diarrhea: Secondary | ICD-10-CM | POA: Insufficient documentation

## 2019-12-15 DIAGNOSIS — D270 Benign neoplasm of right ovary: Secondary | ICD-10-CM | POA: Diagnosis not present

## 2019-12-15 DIAGNOSIS — N72 Inflammatory disease of cervix uteri: Secondary | ICD-10-CM | POA: Insufficient documentation

## 2019-12-15 DIAGNOSIS — D250 Submucous leiomyoma of uterus: Secondary | ICD-10-CM | POA: Insufficient documentation

## 2019-12-15 DIAGNOSIS — N838 Other noninflammatory disorders of ovary, fallopian tube and broad ligament: Secondary | ICD-10-CM | POA: Diagnosis not present

## 2019-12-15 DIAGNOSIS — D649 Anemia, unspecified: Secondary | ICD-10-CM | POA: Diagnosis not present

## 2019-12-15 DIAGNOSIS — Z886 Allergy status to analgesic agent status: Secondary | ICD-10-CM | POA: Insufficient documentation

## 2019-12-15 DIAGNOSIS — Z885 Allergy status to narcotic agent status: Secondary | ICD-10-CM | POA: Insufficient documentation

## 2019-12-15 DIAGNOSIS — Z8249 Family history of ischemic heart disease and other diseases of the circulatory system: Secondary | ICD-10-CM | POA: Insufficient documentation

## 2019-12-15 DIAGNOSIS — Z880 Allergy status to penicillin: Secondary | ICD-10-CM | POA: Insufficient documentation

## 2019-12-15 DIAGNOSIS — D251 Intramural leiomyoma of uterus: Secondary | ICD-10-CM | POA: Diagnosis not present

## 2019-12-15 DIAGNOSIS — I10 Essential (primary) hypertension: Secondary | ICD-10-CM | POA: Diagnosis not present

## 2019-12-15 DIAGNOSIS — Z79899 Other long term (current) drug therapy: Secondary | ICD-10-CM | POA: Diagnosis not present

## 2019-12-15 HISTORY — PX: CYSTOSCOPY: SHX5120

## 2019-12-15 HISTORY — PX: LAPAROSCOPIC BILATERAL SALPINGECTOMY: SHX5889

## 2019-12-15 HISTORY — PX: LAPAROSCOPIC HYSTERECTOMY: SHX1926

## 2019-12-15 LAB — POCT I-STAT, CHEM 8
BUN: 15 mg/dL (ref 6–20)
Calcium, Ion: 1.07 mmol/L — ABNORMAL LOW (ref 1.15–1.40)
Chloride: 97 mmol/L — ABNORMAL LOW (ref 98–111)
Creatinine, Ser: 0.9 mg/dL (ref 0.44–1.00)
Glucose, Bld: 87 mg/dL (ref 70–99)
HCT: 40 % (ref 36.0–46.0)
Hemoglobin: 13.6 g/dL (ref 12.0–15.0)
Potassium: 3.2 mmol/L — ABNORMAL LOW (ref 3.5–5.1)
Sodium: 137 mmol/L (ref 135–145)
TCO2: 27 mmol/L (ref 22–32)

## 2019-12-15 LAB — POCT PREGNANCY, URINE: Preg Test, Ur: NEGATIVE

## 2019-12-15 SURGERY — HYSTERECTOMY, TOTAL, LAPAROSCOPIC
Anesthesia: General | Laterality: Right

## 2019-12-15 MED ORDER — ACETAMINOPHEN 500 MG PO TABS: 1000.0000 mg | ORAL_TABLET | ORAL | Status: AC

## 2019-12-15 MED ORDER — EPHEDRINE 5 MG/ML INJ
INTRAVENOUS | Status: AC
  Filled 2019-12-15: qty 10

## 2019-12-15 MED ORDER — LIDOCAINE HCL (PF) 2 % IJ SOLN
INTRAMUSCULAR | Status: AC
  Filled 2019-12-15: qty 5

## 2019-12-15 MED ORDER — ROCURONIUM BROMIDE 100 MG/10ML IV SOLN
INTRAVENOUS | Status: DC | PRN
  Administered 2019-12-15: 50 mg via INTRAVENOUS
  Administered 2019-12-15 (×2): 20 mg via INTRAVENOUS

## 2019-12-15 MED ORDER — LIDOCAINE HCL (CARDIAC) PF 100 MG/5ML IV SOSY
PREFILLED_SYRINGE | INTRAVENOUS | Status: DC | PRN
  Administered 2019-12-15: 100 mg via INTRAVENOUS

## 2019-12-15 MED ORDER — GABAPENTIN 800 MG PO TABS: 800.0000 mg | ORAL_TABLET | Freq: Every day | ORAL | 0 refills | Status: DC

## 2019-12-15 MED ORDER — DOCUSATE SODIUM 100 MG PO CAPS: 100.0000 mg | ORAL_CAPSULE | Freq: Two times a day (BID) | ORAL | 0 refills | Status: DC

## 2019-12-15 MED ORDER — LACTATED RINGERS IV SOLN: INTRAVENOUS | Status: DC

## 2019-12-15 MED ORDER — MIDAZOLAM HCL 2 MG/2ML IJ SOLN
INTRAMUSCULAR | Status: AC
  Filled 2019-12-15: qty 2

## 2019-12-15 MED ORDER — TRAMADOL HCL 50 MG PO TABS: 50.0000 mg | ORAL_TABLET | Freq: Four times a day (QID) | ORAL | Status: DC | PRN

## 2019-12-15 MED ORDER — TRAMADOL HCL 50 MG PO TABS: 50.0000 mg | ORAL_TABLET | Freq: Four times a day (QID) | ORAL | 0 refills | Status: DC | PRN

## 2019-12-15 MED ORDER — CLINDAMYCIN PHOSPHATE 900 MG/50ML IV SOLN
INTRAVENOUS | Status: AC
  Filled 2019-12-15: qty 50

## 2019-12-15 MED ORDER — PROPOFOL 10 MG/ML IV BOLUS
INTRAVENOUS | Status: DC | PRN
  Administered 2019-12-15: 150 mg via INTRAVENOUS

## 2019-12-15 MED ORDER — ROCURONIUM BROMIDE 10 MG/ML (PF) SYRINGE
PREFILLED_SYRINGE | INTRAVENOUS | Status: AC
  Filled 2019-12-15: qty 10

## 2019-12-15 MED ORDER — ACETAMINOPHEN 500 MG PO TABS: 1000.0000 mg | ORAL_TABLET | Freq: Four times a day (QID) | ORAL | 0 refills | Status: AC

## 2019-12-15 MED ORDER — FENTANYL CITRATE (PF) 100 MCG/2ML IJ SOLN
INTRAMUSCULAR | Status: AC
  Filled 2019-12-15: qty 2

## 2019-12-15 MED ORDER — ONDANSETRON 4 MG PO TBDP: 4.0000 mg | ORAL_TABLET | Freq: Four times a day (QID) | ORAL | 0 refills | Status: AC | PRN

## 2019-12-15 MED ORDER — FENTANYL CITRATE (PF) 100 MCG/2ML IJ SOLN: 25.0000 ug | INTRAMUSCULAR | Status: DC | PRN

## 2019-12-15 MED ORDER — ACETAMINOPHEN 500 MG PO TABS
ORAL_TABLET | ORAL | Status: AC
  Administered 2019-12-15: 1000 mg via ORAL
  Filled 2019-12-15: qty 2

## 2019-12-15 MED ORDER — DEXAMETHASONE SODIUM PHOSPHATE 10 MG/ML IJ SOLN
INTRAMUSCULAR | Status: AC
  Filled 2019-12-15: qty 1

## 2019-12-15 MED ORDER — PROPOFOL 10 MG/ML IV BOLUS
INTRAVENOUS | Status: AC
  Filled 2019-12-15: qty 20

## 2019-12-15 MED ORDER — PROPOFOL 500 MG/50ML IV EMUL
INTRAVENOUS | Status: DC | PRN
  Administered 2019-12-15: 20 ug/kg/min via INTRAVENOUS

## 2019-12-15 MED ORDER — GABAPENTIN 300 MG PO CAPS: 300.0000 mg | ORAL_CAPSULE | ORAL | Status: AC

## 2019-12-15 MED ORDER — MIDAZOLAM HCL 2 MG/2ML IJ SOLN
INTRAMUSCULAR | Status: DC | PRN
  Administered 2019-12-15: 2 mg via INTRAVENOUS

## 2019-12-15 MED ORDER — BUPIVACAINE HCL 0.5 % IJ SOLN
INTRAMUSCULAR | Status: DC | PRN
  Administered 2019-12-15: 9 mL

## 2019-12-15 MED ORDER — KETOROLAC TROMETHAMINE 30 MG/ML IJ SOLN
INTRAMUSCULAR | Status: DC | PRN
  Administered 2019-12-15: 30 mg via INTRAVENOUS

## 2019-12-15 MED ORDER — EPHEDRINE SULFATE 50 MG/ML IJ SOLN
INTRAMUSCULAR | Status: DC | PRN
  Administered 2019-12-15: 5 mg via INTRAVENOUS
  Administered 2019-12-15: 10 mg via INTRAVENOUS

## 2019-12-15 MED ORDER — KETOROLAC TROMETHAMINE 30 MG/ML IJ SOLN
INTRAMUSCULAR | Status: AC
  Filled 2019-12-15: qty 1

## 2019-12-15 MED ORDER — PHENYLEPHRINE HCL (PRESSORS) 10 MG/ML IV SOLN
INTRAVENOUS | Status: AC
  Filled 2019-12-15: qty 1

## 2019-12-15 MED ORDER — SUGAMMADEX SODIUM 200 MG/2ML IV SOLN
INTRAVENOUS | Status: DC | PRN
  Administered 2019-12-15: 200 mg via INTRAVENOUS

## 2019-12-15 MED ORDER — PROPOFOL 500 MG/50ML IV EMUL
INTRAVENOUS | Status: AC
  Filled 2019-12-15: qty 50

## 2019-12-15 MED ORDER — PROMETHAZINE HCL 25 MG/ML IJ SOLN: 6.2500 mg | INTRAMUSCULAR | Status: DC | PRN

## 2019-12-15 MED ORDER — ONDANSETRON HCL 4 MG/2ML IJ SOLN
INTRAMUSCULAR | Status: AC
  Filled 2019-12-15: qty 2

## 2019-12-15 MED ORDER — IBUPROFEN 800 MG PO TABS: 800.0000 mg | ORAL_TABLET | Freq: Three times a day (TID) | ORAL | 1 refills | Status: DC

## 2019-12-15 MED ORDER — BUPIVACAINE HCL (PF) 0.5 % IJ SOLN
INTRAMUSCULAR | Status: AC
  Filled 2019-12-15: qty 30

## 2019-12-15 MED ORDER — GABAPENTIN 300 MG PO CAPS
ORAL_CAPSULE | ORAL | Status: AC
  Administered 2019-12-15: 06:00:00 300 mg via ORAL
  Filled 2019-12-15: qty 1

## 2019-12-15 MED ORDER — SEVOFLURANE IN SOLN
RESPIRATORY_TRACT | Status: AC
  Filled 2019-12-15: qty 250

## 2019-12-15 MED ORDER — SUCCINYLCHOLINE CHLORIDE 200 MG/10ML IV SOSY
PREFILLED_SYRINGE | INTRAVENOUS | Status: AC
  Filled 2019-12-15: qty 10

## 2019-12-15 MED ORDER — OXYCODONE-ACETAMINOPHEN 5-325 MG PO TABS: 1.0000 | ORAL_TABLET | ORAL | Status: DC | PRN

## 2019-12-15 MED ORDER — PHENYLEPHRINE HCL (PRESSORS) 10 MG/ML IV SOLN
INTRAVENOUS | Status: DC | PRN
  Administered 2019-12-15: 150 ug via INTRAVENOUS
  Administered 2019-12-15 (×5): 100 ug via INTRAVENOUS

## 2019-12-15 MED ORDER — ONDANSETRON HCL 4 MG/2ML IJ SOLN
INTRAMUSCULAR | Status: DC | PRN
  Administered 2019-12-15: 4 mg via INTRAVENOUS

## 2019-12-15 MED ORDER — MEPERIDINE HCL 50 MG/ML IJ SOLN: 6.2500 mg | INTRAMUSCULAR | Status: DC | PRN

## 2019-12-15 MED ORDER — SODIUM CHLORIDE 0.9 % IV SOLN
INTRAVENOUS | Status: DC | PRN
  Administered 2019-12-15: 30 ug/min via INTRAVENOUS

## 2019-12-15 MED ORDER — PANTOPRAZOLE SODIUM 40 MG PO TBEC: 40.0000 mg | DELAYED_RELEASE_TABLET | Freq: Every day | ORAL | 1 refills | Status: DC

## 2019-12-15 MED ORDER — FENTANYL CITRATE (PF) 100 MCG/2ML IJ SOLN
INTRAMUSCULAR | Status: DC | PRN
  Administered 2019-12-15: 100 ug via INTRAVENOUS
  Administered 2019-12-15: 25 ug via INTRAVENOUS
  Administered 2019-12-15: 50 ug via INTRAVENOUS
  Administered 2019-12-15: 25 ug via INTRAVENOUS

## 2019-12-15 MED ORDER — TRAMADOL HCL 50 MG PO TABS
ORAL_TABLET | ORAL | Status: AC
  Administered 2019-12-15: 14:00:00 50 mg via ORAL
  Filled 2019-12-15: qty 1

## 2019-12-15 MED ORDER — OXYCODONE HCL 5 MG PO TABS: 5.0000 mg | ORAL_TABLET | ORAL | 0 refills | Status: DC | PRN

## 2019-12-15 MED ORDER — METOPROLOL TARTRATE 5 MG/5ML IV SOLN
INTRAVENOUS | Status: AC
  Filled 2019-12-15: qty 5

## 2019-12-15 MED ORDER — DEXAMETHASONE SODIUM PHOSPHATE 10 MG/ML IJ SOLN
INTRAMUSCULAR | Status: DC | PRN
  Administered 2019-12-15: 10 mg via INTRAVENOUS

## 2019-12-15 SURGICAL SUPPLY — 73 items
"PENCIL ELECTRO HAND CTR " (MISCELLANEOUS) IMPLANT
BAG URINE DRAIN 2000ML AR STRL (UROLOGICAL SUPPLIES) ×10 IMPLANT
BLADE SURG SZ11 CARB STEEL (BLADE) ×6 IMPLANT
CATH FOLEY 2WAY  5CC 16FR (CATHETERS) ×2
CATH URETL WHISTLE TIP 3FR (CATHETERS) ×2 IMPLANT
CATH URTH 16FR FL 2W BLN LF (CATHETERS) ×4 IMPLANT
CHLORAPREP W/TINT 26 (MISCELLANEOUS) ×6 IMPLANT
CLOSURE WOUND 1/4X4 (GAUZE/BANDAGES/DRESSINGS)
CORD MONOPOLAR M/FML 12FT (MISCELLANEOUS) ×4 IMPLANT
COUNTER NEEDLE 20/40 LG (NEEDLE) ×6 IMPLANT
COVER LIGHT HANDLE STERIS (MISCELLANEOUS) ×12 IMPLANT
COVER WAND RF STERILE (DRAPES) ×6 IMPLANT
DERMABOND ADVANCED (GAUZE/BANDAGES/DRESSINGS) ×2
DERMABOND ADVANCED .7 DNX12 (GAUZE/BANDAGES/DRESSINGS) ×4 IMPLANT
DEVICE SUTURE ENDOST 10MM (ENDOMECHANICALS) ×2 IMPLANT
DRAPE GENERAL ENDO 106X123.5 (DRAPES) ×4 IMPLANT
DRAPE STERI POUCH LG 24X46 STR (DRAPES) ×6 IMPLANT
DRAPE UNDER BUTTOCK W/FLU (DRAPES) ×6 IMPLANT
DRSG TEGADERM 2-3/8X2-3/4 SM (GAUZE/BANDAGES/DRESSINGS) ×12 IMPLANT
GLOVE BIO SURGEON STRL SZ7 (GLOVE) ×24 IMPLANT
GLOVE INDICATOR 7.5 STRL GRN (GLOVE) ×12 IMPLANT
GOWN STRL REUS W/ TWL LRG LVL3 (GOWN DISPOSABLE) ×8 IMPLANT
GOWN STRL REUS W/ TWL XL LVL3 (GOWN DISPOSABLE) ×4 IMPLANT
GOWN STRL REUS W/TWL LRG LVL3 (GOWN DISPOSABLE) ×8
GOWN STRL REUS W/TWL XL LVL3 (GOWN DISPOSABLE) ×2
GRASPER SUT TROCAR 14GX15 (MISCELLANEOUS) ×6 IMPLANT
IRRIGATION STRYKERFLOW (MISCELLANEOUS) ×4 IMPLANT
IRRIGATOR STRYKERFLOW (MISCELLANEOUS) ×6
IV NS 1000ML (IV SOLUTION) ×4
IV NS 1000ML BAXH (IV SOLUTION) ×4 IMPLANT
KIT PINK PAD W/HEAD ARE REST (MISCELLANEOUS) ×6
KIT PINK PAD W/HEAD ARM REST (MISCELLANEOUS) ×4 IMPLANT
KIT TURNOVER CYSTO (KITS) ×6 IMPLANT
L-HOOK LAP DISP 36CM (ELECTROSURGICAL) ×6
LABEL OR SOLS (LABEL) ×6 IMPLANT
LHOOK LAP DISP 36CM (ELECTROSURGICAL) IMPLANT
LIGASURE VESSEL 5MM BLUNT TIP (ELECTROSURGICAL) ×2 IMPLANT
MANIPULATOR VCARE LG CRV RETR (MISCELLANEOUS) IMPLANT
MANIPULATOR VCARE SML CRV RETR (MISCELLANEOUS) IMPLANT
MANIPULATOR VCARE STD CRV RETR (MISCELLANEOUS) ×2 IMPLANT
NDL FILTER BLUNT 18X1 1/2 (NEEDLE) ×4 IMPLANT
NEEDLE FILTER BLUNT 18X 1/2SAF (NEEDLE)
NEEDLE FILTER BLUNT 18X1 1/2 (NEEDLE) IMPLANT
NS IRRIG 500ML POUR BTL (IV SOLUTION) ×6 IMPLANT
OCCLUDER COLPOPNEUMO (BALLOONS) ×6 IMPLANT
PACK GYN LAPAROSCOPIC (MISCELLANEOUS) ×6 IMPLANT
PAD OB MATERNITY 4.3X12.25 (PERSONAL CARE ITEMS) ×6 IMPLANT
PAD PREP 24X41 OB/GYN DISP (PERSONAL CARE ITEMS) ×6 IMPLANT
PENCIL ELECTRO HAND CTR (MISCELLANEOUS) ×6 IMPLANT
POUCH SPECIMEN RETRIEVAL 10MM (ENDOMECHANICALS) IMPLANT
SCISSORS METZENBAUM CVD 33 (INSTRUMENTS) IMPLANT
SET CYSTO W/LG BORE CLAMP LF (SET/KITS/TRAYS/PACK) IMPLANT
SET TUBE SMOKE EVAC HIGH FLOW (TUBING) ×4 IMPLANT
SLEEVE ENDOPATH XCEL 5M (ENDOMECHANICALS) ×6 IMPLANT
SPONGE GAUZE 2X2 8PLY STER LF (GAUZE/BANDAGES/DRESSINGS)
SPONGE GAUZE 2X2 8PLY STRL LF (GAUZE/BANDAGES/DRESSINGS) ×8 IMPLANT
STRIP CLOSURE SKIN 1/4X4 (GAUZE/BANDAGES/DRESSINGS) ×4 IMPLANT
SUT ENDO VLOC 180-0-8IN (SUTURE) IMPLANT
SUT MNCRL 4-0 (SUTURE)
SUT MNCRL 4-0 27XMFL (SUTURE)
SUT MNCRL AB 4-0 PS2 18 (SUTURE) ×4 IMPLANT
SUT VIC AB 0 CT1 36 (SUTURE) ×12 IMPLANT
SUT VIC AB 2-0 UR6 27 (SUTURE) ×6 IMPLANT
SUT VIC AB 4-0 SH 27 (SUTURE) ×2
SUT VIC AB 4-0 SH 27XANBCTRL (SUTURE) ×4 IMPLANT
SUTURE MNCRL 4-0 27XMF (SUTURE) ×4 IMPLANT
SYR 10ML LL (SYRINGE) ×6 IMPLANT
SYR 50ML LL SCALE MARK (SYRINGE) ×4 IMPLANT
SYR 5ML LL (SYRINGE) ×4 IMPLANT
TROCAR ENDO BLADELESS 11MM (ENDOMECHANICALS) ×6 IMPLANT
TROCAR XCEL NON-BLD 5MMX100MML (ENDOMECHANICALS) ×6 IMPLANT
TUBING ART PRESS 48 MALE/FEM (TUBING) IMPLANT
TUBING EVAC SMOKE HEATED PNEUM (TUBING) ×6 IMPLANT

## 2019-12-15 NOTE — Interval H&P Note (Signed)
History and Physical Interval Note:  12/15/2019 7:28 AM  Natalie Strickland  has presented today for surgery, with the diagnosis of fibroids, AUB, dermoid in (R) ovary.  The various methods of treatment have been discussed with the patient and family. After consideration of risks, benefits and other options for treatment, the patient has consented to  Procedure(s) with comments: HYSTERECTOMY TOTAL LAPAROSCOPIC (N/A) - REQUESTING RNFA LAPAROSCOPIC BILATERAL SALPINGECTOMY (Bilateral) LAPAROSCOPIC OOPHORECTOMY (Right) as a surgical intervention.  The patient's history has been reviewed, patient examined, no change in status, stable for surgery.  I have reviewed the patient's chart and labs.  Questions were answered to the patient's satisfaction.     Benjaman Kindler

## 2019-12-15 NOTE — Op Note (Signed)
Natalie Strickland PROCEDURE DATE: 12/15/2019  PREOPERATIVE DIAGNOSIS: Fibroids, dermoid POSTOPERATIVE DIAGNOSIS: The same PROCEDURE:  HYSTERECTOMY TOTAL LAPAROSCOPIC:  LAPAROSCOPIC BILATERAL SALPINGECTOMY:  LAPAROSCOPIC OOPHORECTOMY:  CYSTOSCOPY:   SURGEON:  Dr. Benjaman Kindler ASSISTANT: RNFA, CNM Anesthesiologist:  Anesthesiologist: Molli Barrows, MD; Emmie Niemann, MD CRNA: Aline Brochure, CRNA; Hedda Slade, CRNA  INDICATIONS: 49 y.o. F  here for definitive surgical management secondary to the indications listed under preoperative diagnoses; please see preoperative note for further details.  Risks of surgery were discussed with the patient including but not limited to: bleeding which may require transfusion or reoperation; infection which may require antibiotics; injury to bowel, bladder, ureters or other surrounding organs; need for additional procedures; thromboembolic phenomenon, incisional problems and other postoperative/anesthesia complications. Written informed consent was obtained.    FINDINGS:  Enlarged boggy uterus, enlarged right ovary. Normal upper abdomen. Fibroids obscuring visualization with bleeding right uterine artery at a fibroid insertion.  ANESTHESIA:    General INTRAVENOUS FLUIDS:1500  ml ESTIMATED BLOOD LOSS:350 ml URINE OUTPUT: 400 ml   SPECIMENS: Uterus, cervix, bilateral fallopian tubes, right ovary COMPLICATIONS: None immediate  PROCEDURE IN DETAIL:  The patient received prophalactic intravenous antibiotics and had sequential compression devices applied to her lower extremities while in the preoperative area.  She was then taken to the operating room where general anesthesia was administered and was found to be adequate.  She was placed in the dorsal lithotomy position, and was prepped and draped in a sterile manner.  A formal time out was performed with all team members present and in agreement.  A V-care uterine manipulator was placed at this time.  A  Foley catheter was inserted into her bladder and attached to constant drainage. Attention was turned to the abdomen and 0.5% Marcaine infused subq. A 46mm umbilical incision was made with the scalpel.  The Optiview 5-mm trocar and sleeve were then advanced without difficulty with the laparoscope under direct visualization into the abdomen.  The abdomen was then insufflated with carbon dioxide gas and adequate pneumoperitoneum was obtained.  A survey of the patient's pelvis and abdomen revealed the findings above.  Bilateral lower quadrant ports (5 mm on the right and 11 mm on the left) were then placed under direct visualization.  The pelvis was then carefully examined.  Attention was turned to the fallopian tubes; these were freed from the underlying mesosalpinx and the uterine attachments using the Ligasure device on the left, and the ovarian vessels secured on the right, being careful of the ureter.  The bilateral round and broad ligaments were then clamped and transected with the Ligasure device.  The uterine artery was then skeletonized and a bladder flap was created.  The ureters were noted to be safely away from the area of dissection.  The bladder was then bluntly dissected off the lower uterine segment.    At this point, attention was turned to the uterine vessels, which were clamped and cauterized using the Ligasure on the left, and then the right. After the uterine blood flow at the level of the internal os was controlled, both arteries were cut with the Ligasure.  Good hemostasis was noted overall.  The uterosacral and cardinal ligaments were clamped, cut and ligated bilaterally .  Attention was then turned to the cervicovaginal junction, and monopolar bovie hook was used to transect the cervix from the surrounding vagina using the ring of the V-care as a guide. This was done circumferentially allowing total hysterectomy.  The uterus was then removed  from the vagina and the vaginal cuff incision was  then closed with running V-loc suture.  Overall excellent hemostasis was noted, except at the insertion of the left uterine artery, where cautery and the stitch was used to assure hemostasis. For this reason, a cystoscopy was performed and was normal. A 5mm whistle tip stent was placed temporarily to assure easy passage of the urine, and good efflux was finally noted from both ostia.  No stitches or injury were visualized in the bladder during cystoscopy.  Attention was returned to the abdomen.The ureters were reexamined bilaterally and were pulsating normally. The abdominal pressure was reduced and hemostasis was confirmed  The 74mm port fascia was closed with a vertical mattress with 0-Vicryl, using the cone closure system. All trocars were removed under direct visualization, and the abdomen was desufflated.  All skin incisions were closed with 4-0 monocryl subcuticular stitches and Dermabond. The patient tolerated the procedures well.  All instruments, needles, and sponge counts were correct x 2. The patient was taken to the recovery room awake, extubated and in stable condition.    ERAS, discharge today

## 2019-12-15 NOTE — Anesthesia Preprocedure Evaluation (Signed)
Anesthesia Evaluation  Patient identified by MRN, date of birth, ID band Patient awake    Reviewed: Allergy & Precautions, NPO status , Patient's Chart, lab work & pertinent test results  History of Anesthesia Complications Negative for: history of anesthetic complications  Airway Mallampati: II  TM Distance: >3 FB Neck ROM: Full    Dental  (+) Poor Dentition, Missing   Pulmonary neg sleep apnea, neg COPD,    breath sounds clear to auscultation- rhonchi (-) wheezing      Cardiovascular Exercise Tolerance: Good hypertension, Pt. on medications (-) CAD, (-) Past MI, (-) Cardiac Stents and (-) CABG  Rhythm:Regular Rate:Normal - Systolic murmurs and - Diastolic murmurs    Neuro/Psych  Headaches, neg Seizures PSYCHIATRIC DISORDERS Anxiety Depression    GI/Hepatic Neg liver ROS, hiatal hernia, GERD  ,  Endo/Other  negative endocrine ROSneg diabetes  Renal/GU negative Renal ROS     Musculoskeletal negative musculoskeletal ROS (+)   Abdominal (+) + obese,   Peds  Hematology  (+) anemia ,   Anesthesia Other Findings Past Medical History: No date: Anemia     Comment:  low iron and vitamin b levels No date: Anxiety No date: Depression No date: Dyspnea No date: GERD (gastroesophageal reflux disease) No date: Headache     Comment:  dt anemia No date: History of hiatal hernia No date: History of kidney stones No date: Hypercholesteremia No date: Hypertension No date: IBS (irritable bowel syndrome) No date: Lymphocytic colitis   Reproductive/Obstetrics                             Anesthesia Physical Anesthesia Plan  ASA: II  Anesthesia Plan: General   Post-op Pain Management:    Induction: Intravenous  PONV Risk Score and Plan: 2 and Ondansetron, Dexamethasone and Midazolam  Airway Management Planned: Oral ETT  Additional Equipment:   Intra-op Plan:   Post-operative Plan:  Extubation in OR  Informed Consent: I have reviewed the patients History and Physical, chart, labs and discussed the procedure including the risks, benefits and alternatives for the proposed anesthesia with the patient or authorized representative who has indicated his/her understanding and acceptance.     Dental advisory given  Plan Discussed with: CRNA and Anesthesiologist  Anesthesia Plan Comments:         Anesthesia Quick Evaluation

## 2019-12-15 NOTE — Discharge Instructions (Signed)
Discharge instructions after   total laparoscopic hysterectomy   For the next three days, if you can, take ibuprofen and acetaminophen on a schedule, every 8 hours. You can take them together or you can intersperse them, and take one every four hours. I know you have gastric upset as well, so avoid the ibuprofen if it causes GI irritation.  I also gave you gabapentin for nighttime, to help you sleep and also to control pain. Take gabapentin medicine at night for at least the next 3 nights. You also have a medicine similiar to a mild narcotic, tramadol, to take as needed if the above medicines don't help.  Postop constipation is a major cause of pain. Stay well hydrated, walk as you tolerate, and take over the counter senna as well as stool softeners if you need them.    Signs and Symptoms to Report Call our office at [phone] if you have any of the following.  . Fever over 100.4 degrees or higher . Severe stomach pain not relieved with pain medications . Bright red bleeding that's heavier than a period that does not slow with rest . To go the bathroom a lot (frequency), you can't hold your urine (urgency), or it hurts when you empty your bladder (urinate) . Chest pain . Shortness of breath . Pain in the calves of your legs . Severe nausea and vomiting not relieved with anti-nausea medications . Signs of infection around your wounds, such as redness, hot to touch, swelling, green/yellow drainage (like pus), bad smelling discharge . Any concerns  What You Can Expect after Surgery . You may see some pink tinged, bloody fluid and bruising around the wound. This is normal. . You may notice shoulder and neck pain. This is caused by the gas used during surgery to expand your abdomen so your surgeon could get to the uterus easier. . You may have a sore throat because of the tube in your mouth during general anesthesia. This will go away in 2 to 3 days. . You may have some stomach cramps. .  You may notice spotting on your panties. . You may have pain around the incision sites.   Activities after Your Discharge Follow these guidelines to help speed your recovery at home: . Do the coughing and deep breathing as you did in the hospital for 2 weeks. Use the small blue breathing device, called the incentive spirometer for 2 weeks. . Don't drive if you are in pain or taking narcotic pain medicine. You may drive when you can safely slam on the brakes, turn the wheel forcefully, and rotate your torso comfortably. This is typically 1-2 weeks. Practice in a parking lot or side street prior to attempting to drive regularly.  . Ask others to help with household chores for 4 weeks. . Do not lift anything heavier that 10 pounds for 4-6 weeks. This includes pets, children, and groceries. . Don't do strenuous activities, exercises, or sports like vacuuming, tennis, squash, etc. until your doctor says it is safe to do so. ---Maintain pelvic rest for 8 weeks. This means nothing in the vagina or rectum at all (no douching, tampons, intercourse) for 8 weeks.  . Walk as you feel able. Rest often since it may take two or three weeks for your energy level to return to normal.  . You may climb stairs . Avoid constipation:   -Eat fruits, vegetables, and whole grains. Eat small meals as your appetite will take time to return to normal.   -  Drink 6 to 8 glasses of water each day unless your doctor has told you to limit your fluids.   -Use a laxative or stool softener as needed if constipation becomes a problem. You may take Miralax, metamucil, Citrucil, Colace, Senekot, FiberCon, etc. If this does not relieve the constipation, try two tablespoons of Milk Of Magnesia every 8 hours until your bowels move.  . You may shower. Gently wash the wounds with a mild soap and water. Pat dry. . Do not get in a hot tub, swimming pool, etc. for 6 weeks. . Do not use lotions, oils, powders on the wounds. . Do not douche,  use tampons, or have sex until your doctor says it is okay. . Take your pain medicine when you need it. The medicine may not work as well if the pain is bad.  Take the medicines you were taking before surgery. Other medications you will need are pain medications and possibly constipation and nausea medications (Zofran).

## 2019-12-15 NOTE — OR Nursing (Signed)
Medication changed from oxycodone to tramadol per Dr. Leafy Ro. Pt given tramadol at this time, will monitor closely for any adverse reactions.

## 2019-12-15 NOTE — Transfer of Care (Signed)
Immediate Anesthesia Transfer of Care Note  Patient: Natalie Strickland  Procedure(s) Performed: HYSTERECTOMY TOTAL LAPAROSCOPIC (N/A ) LAPAROSCOPIC BILATERAL SALPINGECTOMY (Bilateral ) LAPAROSCOPIC OOPHORECTOMY (Right ) CYSTOSCOPY  Patient Location: PACU  Anesthesia Type:General  Level of Consciousness: sedated  Airway & Oxygen Therapy: Patient Spontanous Breathing and Patient connected to face mask oxygen  Post-op Assessment: Report given to RN and Post -op Vital signs reviewed and stable  Post vital signs: Reviewed and stable  Last Vitals:  Vitals Value Taken Time  BP 127/57 12/15/19 1125  Temp 36.6 C 12/15/19 1125  Pulse 78 12/15/19 1126  Resp 19 12/15/19 1126  SpO2 96 % 12/15/19 1126  Vitals shown include unvalidated device data.  Last Pain:  Vitals:   12/15/19 1125  TempSrc:   PainSc: 0-No pain         Complications: No apparent anesthesia complications

## 2019-12-15 NOTE — Anesthesia Procedure Notes (Signed)
Procedure Name: Intubation Date/Time: 12/15/2019 7:41 AM Performed by: Hedda Slade, CRNA Pre-anesthesia Checklist: Patient identified, Patient being monitored, Timeout performed, Emergency Drugs available and Suction available Patient Re-evaluated:Patient Re-evaluated prior to induction Oxygen Delivery Method: Circle system utilized Preoxygenation: Pre-oxygenation with 100% oxygen Induction Type: IV induction Ventilation: Mask ventilation without difficulty Laryngoscope Size: 3 and McGraph Grade View: Grade I Tube type: Oral Tube size: 7.0 mm Number of attempts: 1 Airway Equipment and Method: Stylet and Video-laryngoscopy Placement Confirmation: ETT inserted through vocal cords under direct vision,  positive ETCO2 and breath sounds checked- equal and bilateral Secured at: 21 cm Tube secured with: Tape Dental Injury: Teeth and Oropharynx as per pre-operative assessment

## 2019-12-15 NOTE — OR Nursing (Signed)
Pt states she has hives with codeine. PT can not take oxycodone for pain. Dr. Leafy Ro called and made aware

## 2019-12-15 NOTE — OR Nursing (Signed)
PT shows no signs of adverse reactions to tramadol, pt is more alert at this time. PT ambulatory and voided prior to discharge. Pt spouse verbalizes understanding of changes made in discharge and medications. This RN infomred spouse to return oxycodone rx to drug store when he picks up tramadol.

## 2019-12-17 LAB — SURGICAL PATHOLOGY

## 2019-12-19 NOTE — Anesthesia Postprocedure Evaluation (Signed)
Anesthesia Post Note  Patient: Natalie Strickland  Procedure(s) Performed: HYSTERECTOMY TOTAL LAPAROSCOPIC (N/A ) LAPAROSCOPIC BILATERAL SALPINGECTOMY (Bilateral ) LAPAROSCOPIC OOPHORECTOMY (Right ) CYSTOSCOPY  Patient location during evaluation: PACU Anesthesia Type: General Level of consciousness: awake and alert Pain management: pain level controlled Vital Signs Assessment: post-procedure vital signs reviewed and stable Respiratory status: spontaneous breathing, nonlabored ventilation, respiratory function stable and patient connected to nasal cannula oxygen Cardiovascular status: blood pressure returned to baseline and stable Postop Assessment: no apparent nausea or vomiting Anesthetic complications: no     Last Vitals:  Vitals:   12/15/19 1309 12/15/19 1427  BP: 129/67 119/64  Pulse: 73 75  Resp: 14 16  Temp: (!) 36.1 C   SpO2: 96% 97%    Last Pain:  Vitals:   12/16/19 0830  TempSrc:   PainSc: 2                  Molli Barrows

## 2020-03-31 ENCOUNTER — Encounter: Payer: Self-pay | Admitting: Emergency Medicine

## 2020-03-31 ENCOUNTER — Emergency Department: Payer: Managed Care, Other (non HMO)

## 2020-03-31 ENCOUNTER — Other Ambulatory Visit: Payer: Self-pay

## 2020-03-31 ENCOUNTER — Inpatient Hospital Stay
Admission: EM | Admit: 2020-03-31 | Discharge: 2020-04-02 | DRG: 177 | Disposition: A | Discharge status: Home / Self Care | Payer: Managed Care, Other (non HMO) | Attending: Internal Medicine | Admitting: Internal Medicine

## 2020-03-31 DIAGNOSIS — U071 COVID-19: Principal | ICD-10-CM

## 2020-03-31 DIAGNOSIS — E7800 Pure hypercholesterolemia, unspecified: Secondary | ICD-10-CM | POA: Diagnosis present

## 2020-03-31 DIAGNOSIS — K219 Gastro-esophageal reflux disease without esophagitis: Secondary | ICD-10-CM

## 2020-03-31 DIAGNOSIS — Z6833 Body mass index (BMI) 33.0-33.9, adult: Secondary | ICD-10-CM

## 2020-03-31 DIAGNOSIS — F329 Major depressive disorder, single episode, unspecified: Secondary | ICD-10-CM | POA: Diagnosis present

## 2020-03-31 DIAGNOSIS — E876 Hypokalemia: Secondary | ICD-10-CM | POA: Diagnosis present

## 2020-03-31 DIAGNOSIS — Z87442 Personal history of urinary calculi: Secondary | ICD-10-CM | POA: Diagnosis not present

## 2020-03-31 DIAGNOSIS — Z880 Allergy status to penicillin: Secondary | ICD-10-CM

## 2020-03-31 DIAGNOSIS — E669 Obesity, unspecified: Secondary | ICD-10-CM | POA: Diagnosis present

## 2020-03-31 DIAGNOSIS — Z885 Allergy status to narcotic agent status: Secondary | ICD-10-CM

## 2020-03-31 DIAGNOSIS — J1282 Pneumonia due to coronavirus disease 2019: Secondary | ICD-10-CM | POA: Diagnosis present

## 2020-03-31 DIAGNOSIS — Z888 Allergy status to other drugs, medicaments and biological substances status: Secondary | ICD-10-CM

## 2020-03-31 DIAGNOSIS — E785 Hyperlipidemia, unspecified: Secondary | ICD-10-CM

## 2020-03-31 DIAGNOSIS — F419 Anxiety disorder, unspecified: Secondary | ICD-10-CM | POA: Diagnosis present

## 2020-03-31 DIAGNOSIS — I10 Essential (primary) hypertension: Secondary | ICD-10-CM

## 2020-03-31 DIAGNOSIS — E860 Dehydration: Secondary | ICD-10-CM | POA: Diagnosis present

## 2020-03-31 DIAGNOSIS — Z8249 Family history of ischemic heart disease and other diseases of the circulatory system: Secondary | ICD-10-CM | POA: Diagnosis not present

## 2020-03-31 DIAGNOSIS — F32A Depression, unspecified: Secondary | ICD-10-CM | POA: Insufficient documentation

## 2020-03-31 DIAGNOSIS — Z79899 Other long term (current) drug therapy: Secondary | ICD-10-CM | POA: Diagnosis not present

## 2020-03-31 LAB — URINALYSIS, COMPLETE (UACMP) WITH MICROSCOPIC
Bacteria, UA: NONE SEEN
Bilirubin Urine: NEGATIVE
Glucose, UA: NEGATIVE mg/dL
Hgb urine dipstick: NEGATIVE
Ketones, ur: NEGATIVE mg/dL
Leukocytes,Ua: NEGATIVE
Nitrite: NEGATIVE
Protein, ur: 100 mg/dL — AB
Specific Gravity, Urine: 1.017 (ref 1.005–1.030)
WBC, UA: NONE SEEN WBC/hpf (ref 0–5)
pH: 7 (ref 5.0–8.0)

## 2020-03-31 LAB — CBC WITH DIFFERENTIAL/PLATELET
Abs Immature Granulocytes: 0.05 10*3/uL (ref 0.00–0.07)
Basophils Absolute: 0 10*3/uL (ref 0.0–0.1)
Basophils Relative: 0 %
Eosinophils Absolute: 0 10*3/uL (ref 0.0–0.5)
Eosinophils Relative: 0 %
HCT: 41.5 % (ref 36.0–46.0)
Hemoglobin: 14.2 g/dL (ref 12.0–15.0)
Immature Granulocytes: 1 %
Lymphocytes Relative: 27 %
Lymphs Abs: 1.8 10*3/uL (ref 0.7–4.0)
MCH: 25.9 pg — ABNORMAL LOW (ref 26.0–34.0)
MCHC: 34.2 g/dL (ref 30.0–36.0)
MCV: 75.6 fL — ABNORMAL LOW (ref 80.0–100.0)
Monocytes Absolute: 0.5 10*3/uL (ref 0.1–1.0)
Monocytes Relative: 8 %
Neutro Abs: 4.3 10*3/uL (ref 1.7–7.7)
Neutrophils Relative %: 64 %
Platelets: 280 10*3/uL (ref 150–400)
RBC: 5.49 MIL/uL — ABNORMAL HIGH (ref 3.87–5.11)
RDW: 15.9 % — ABNORMAL HIGH (ref 11.5–15.5)
WBC: 6.7 10*3/uL (ref 4.0–10.5)
nRBC: 0 % (ref 0.0–0.2)

## 2020-03-31 LAB — COMPREHENSIVE METABOLIC PANEL
ALT: 100 U/L — ABNORMAL HIGH (ref 0–44)
AST: 72 U/L — ABNORMAL HIGH (ref 15–41)
Albumin: 4 g/dL (ref 3.5–5.0)
Alkaline Phosphatase: 69 U/L (ref 38–126)
Anion gap: 13 (ref 5–15)
BUN: 12 mg/dL (ref 6–20)
CO2: 28 mmol/L (ref 22–32)
Calcium: 8.8 mg/dL — ABNORMAL LOW (ref 8.9–10.3)
Chloride: 91 mmol/L — ABNORMAL LOW (ref 98–111)
Creatinine, Ser: 0.87 mg/dL (ref 0.44–1.00)
GFR calc Af Amer: >60 mL/min (ref >60)
GFR calc non Af Amer: >60 mL/min (ref >60)
Glucose, Bld: 134 mg/dL — ABNORMAL HIGH (ref 70–99)
Potassium: 2.4 mmol/L — CL (ref 3.5–5.1)
Sodium: 132 mmol/L — ABNORMAL LOW (ref 135–145)
Total Bilirubin: 0.8 mg/dL (ref 0.3–1.2)
Total Protein: 7.8 g/dL (ref 6.5–8.1)

## 2020-03-31 LAB — MONONUCLEOSIS SCREEN: Mono Screen: NEGATIVE

## 2020-03-31 LAB — PROCALCITONIN: Procalcitonin: <0.1 ng/mL

## 2020-03-31 LAB — LIPASE, BLOOD: Lipase: 43 U/L (ref 11–51)

## 2020-03-31 LAB — HIV ANTIBODY (ROUTINE TESTING W REFLEX): HIV Screen 4th Generation wRfx: NONREACTIVE

## 2020-03-31 LAB — MAGNESIUM: Magnesium: 1.9 mg/dL (ref 1.7–2.4)

## 2020-03-31 LAB — FIBRIN DERIVATIVES D-DIMER (ARMC ONLY): Fibrin derivatives D-dimer (ARMC): 675.41 ng/mL (FEU) — ABNORMAL HIGH (ref 0.00–499.00)

## 2020-03-31 LAB — SARS CORONAVIRUS 2 BY RT PCR (HOSPITAL ORDER, PERFORMED IN ~~LOC~~ HOSPITAL LAB): SARS Coronavirus 2: POSITIVE — AB

## 2020-03-31 LAB — ABO/RH: ABO/RH(D): O POS

## 2020-03-31 LAB — C-REACTIVE PROTEIN: CRP: 1 mg/dL — ABNORMAL HIGH (ref <1.0)

## 2020-03-31 MED ORDER — SODIUM CHLORIDE 0.9 % IV BOLUS
1000.0000 mL | Freq: Once | INTRAVENOUS | Status: AC
  Administered 2020-03-31: 1000 mL via INTRAVENOUS

## 2020-03-31 MED ORDER — SODIUM CHLORIDE 0.9 % IV SOLN
100.0000 mg | Freq: Every day | INTRAVENOUS | Status: DC
  Administered 2020-04-01 – 2020-04-02 (×2): 100 mg via INTRAVENOUS
  Filled 2020-03-31 (×2): qty 100

## 2020-03-31 MED ORDER — POTASSIUM CHLORIDE 10 MEQ/100ML IV SOLN
10.0000 meq | INTRAVENOUS | Status: AC
  Administered 2020-03-31 (×2): 10 meq via INTRAVENOUS
  Filled 2020-03-31: qty 100

## 2020-03-31 MED ORDER — POTASSIUM CHLORIDE IN NACL 40-0.9 MEQ/L-% IV SOLN
INTRAVENOUS | Status: DC
  Filled 2020-03-31 (×5): qty 1000

## 2020-03-31 MED ORDER — ZINC SULFATE 220 (50 ZN) MG PO CAPS
220.0000 mg | ORAL_CAPSULE | Freq: Every day | ORAL | Status: DC
  Administered 2020-04-01 – 2020-04-02 (×2): 220 mg via ORAL
  Filled 2020-03-31 (×2): qty 1

## 2020-03-31 MED ORDER — SODIUM CHLORIDE 0.9 % IV SOLN
200.0000 mg | Freq: Once | INTRAVENOUS | Status: AC
  Administered 2020-03-31: 200 mg via INTRAVENOUS
  Filled 2020-03-31: qty 200

## 2020-03-31 MED ORDER — LEVOCETIRIZINE DIHYDROCHLORIDE 5 MG PO TABS: 5.0000 mg | ORAL_TABLET | Freq: Every day | ORAL | Status: DC

## 2020-03-31 MED ORDER — POTASSIUM CHLORIDE 10 MEQ/100ML IV SOLN
10.0000 meq | Freq: Once | INTRAVENOUS | Status: AC
  Administered 2020-03-31: 10 meq via INTRAVENOUS
  Filled 2020-03-31: qty 100

## 2020-03-31 MED ORDER — PANTOPRAZOLE SODIUM 40 MG IV SOLR
40.0000 mg | Freq: Two times a day (BID) | INTRAVENOUS | Status: DC
  Administered 2020-03-31 – 2020-04-02 (×5): 40 mg via INTRAVENOUS
  Filled 2020-03-31 (×5): qty 40

## 2020-03-31 MED ORDER — IOHEXOL 300 MG/ML  SOLN
100.0000 mL | Freq: Once | INTRAMUSCULAR | Status: AC | PRN
  Administered 2020-03-31: 100 mL via INTRAVENOUS

## 2020-03-31 MED ORDER — LORATADINE 10 MG PO TABS
10.0000 mg | ORAL_TABLET | Freq: Every day | ORAL | Status: DC
  Administered 2020-03-31 – 2020-04-01 (×2): 10 mg via ORAL
  Filled 2020-03-31 (×2): qty 1

## 2020-03-31 MED ORDER — LISINOPRIL 20 MG PO TABS
20.0000 mg | ORAL_TABLET | Freq: Every evening | ORAL | Status: DC
  Administered 2020-03-31 – 2020-04-01 (×2): 20 mg via ORAL
  Filled 2020-03-31 (×2): qty 1

## 2020-03-31 MED ORDER — ENOXAPARIN SODIUM 40 MG/0.4ML ~~LOC~~ SOLN
40.0000 mg | SUBCUTANEOUS | Status: DC
  Administered 2020-03-31 – 2020-04-01 (×2): 40 mg via SUBCUTANEOUS
  Filled 2020-03-31 (×2): qty 0.4

## 2020-03-31 MED ORDER — DEXAMETHASONE SODIUM PHOSPHATE 10 MG/ML IJ SOLN
6.0000 mg | INTRAMUSCULAR | Status: DC
  Administered 2020-03-31 – 2020-04-01 (×2): 6 mg via INTRAVENOUS
  Filled 2020-03-31 (×2): qty 1

## 2020-03-31 MED ORDER — ONDANSETRON HCL 4 MG/2ML IJ SOLN
4.0000 mg | Freq: Once | INTRAMUSCULAR | Status: AC
  Administered 2020-03-31: 4 mg via INTRAVENOUS
  Filled 2020-03-31: qty 2

## 2020-03-31 MED ORDER — ASCORBIC ACID 500 MG PO TABS
500.0000 mg | ORAL_TABLET | Freq: Two times a day (BID) | ORAL | Status: DC
  Administered 2020-03-31 – 2020-04-02 (×4): 500 mg via ORAL
  Filled 2020-03-31 (×4): qty 1

## 2020-03-31 MED ORDER — ALBUTEROL SULFATE HFA 108 (90 BASE) MCG/ACT IN AERS
2.0000 | INHALATION_SPRAY | Freq: Four times a day (QID) | RESPIRATORY_TRACT | Status: DC
  Administered 2020-03-31 – 2020-04-02 (×7): 2 via RESPIRATORY_TRACT
  Filled 2020-03-31 (×2): qty 6.7

## 2020-03-31 MED ORDER — BUSPIRONE HCL 15 MG PO TABS
30.0000 mg | ORAL_TABLET | Freq: Two times a day (BID) | ORAL | Status: DC
  Administered 2020-03-31 – 2020-04-02 (×4): 30 mg via ORAL
  Filled 2020-03-31 (×5): qty 2

## 2020-03-31 MED ORDER — SIMVASTATIN 20 MG PO TABS
20.0000 mg | ORAL_TABLET | Freq: Every evening | ORAL | Status: DC
  Administered 2020-03-31 – 2020-04-01 (×2): 20 mg via ORAL
  Filled 2020-03-31 (×2): qty 1

## 2020-03-31 MED ORDER — SERTRALINE HCL 50 MG PO TABS
50.0000 mg | ORAL_TABLET | Freq: Every evening | ORAL | Status: DC
  Administered 2020-03-31 – 2020-04-01 (×2): 50 mg via ORAL
  Filled 2020-03-31 (×2): qty 1

## 2020-03-31 NOTE — ED Triage Notes (Signed)
Patient ambulatory to triage with steady gait, without difficulty or distress noted;pt reports N/V/D x wk

## 2020-03-31 NOTE — ED Notes (Signed)
Pt provided with lunch.

## 2020-03-31 NOTE — Consult Note (Signed)
Remdesivir - Pharmacy Brief Note   O:  ALT: 100 CXR: "patchy ground-glass opacities in the right lung base" SpO2: 95-100% on RA   A/P:  03/31/20 SARS-CoV-2 PCR positive  Remdesivir 200 mg IVPB once followed by 100 mg IVPB daily x 4 days.   Benita Gutter 03/31/2020 1:08 PM

## 2020-03-31 NOTE — Plan of Care (Signed)
  Problem: Education: Goal: Knowledge of risk factors and measures for prevention of condition will improve Outcome: Progressing   Problem: Coping: Goal: Psychosocial and spiritual needs will be supported Outcome: Progressing   Problem: Respiratory: Goal: Will maintain a patent airway Outcome: Progressing Goal: Complications related to the disease process, condition or treatment will be avoided or minimized Outcome: Progressing   

## 2020-03-31 NOTE — ED Notes (Signed)
Pt transport placed by unit secretary, Merton Border.

## 2020-03-31 NOTE — ED Notes (Signed)
Pt to CT

## 2020-03-31 NOTE — ED Notes (Signed)
Pt walking to toilet independently with a steady gait.

## 2020-03-31 NOTE — ED Notes (Signed)
Pharmacy notified of med verification and missing meds.

## 2020-03-31 NOTE — ED Provider Notes (Signed)
[facility] Emergency Department Provider Note   ____________________________________________   First MD Initiated Contact with Patient 03/31/20 1012     (approximate)  I have reviewed the triage vital signs and the nursing notes.   HISTORY  Chief Complaint Emesis    HPI Natalie Strickland is a 49 y.o. female has been feeling ill for about a week's time now, she started with what she describes "allergies" with sinus drainage.  She got vaccinated roughly Wednesday of last week receiving her first COVID-19 vaccine and then continued to have worsening symptoms including nausea vomiting loose stools and progressive increasing weakness and feeling dehydrated with dark urine.  No chest pain.  Mild feeling of shortness of breath.  Dry cough.  States associated upper abdominal pain with lots of nausea.  Feels rundown dehydrated     Past Medical History:  Diagnosis Date  . Anemia    low iron and vitamin b levels  . Anxiety   . Depression   . Dyspnea   . GERD (gastroesophageal reflux disease)   . Headache    dt anemia  . History of hiatal hernia   . History of kidney stones   . Hypercholesteremia   . Hypertension   . IBS (irritable bowel syndrome)   . Lymphocytic colitis     Patient Active Problem List   Diagnosis Date Noted  . Hypokalemia 03/31/2020    Past Surgical History:  Procedure Laterality Date  . CARPAL TUNNEL RELEASE Right   . COLONOSCOPY    . CYSTOSCOPY  12/15/2019   Procedure: CYSTOSCOPY;  Surgeon: Benjaman Kindler, MD;  Location: [facility] ORS;  Service: Gynecology;;  . [place]  2005  . LAPAROSCOPIC BILATERAL SALPINGECTOMY Bilateral 12/15/2019   Procedure: LAPAROSCOPIC BILATERAL SALPINGECTOMY;  Surgeon: Benjaman Kindler, MD;  Location: [facility] ORS;  Service: Gynecology;  Laterality: Bilateral;  . LAPAROSCOPIC HYSTERECTOMY N/A 12/15/2019   Procedure: HYSTERECTOMY TOTAL LAPAROSCOPIC;  Surgeon: Benjaman Kindler, MD;  Location: [facility] ORS;   Service: Gynecology;  Laterality: N/A;  REQUESTING RNFA  . TUBAL LIGATION  1990  . WISDOM TOOTH EXTRACTION      Prior to Admission medications   Medication Sig Start Date End Date Taking? Authorizing Provider  busPIRone (BUSPAR) 15 MG tablet Take 30 mg by mouth daily. 11/08/19   [provider]  Cholecalciferol (VITAMIN D3) 50 MCG (2000 UT) TABS Take 2,000 Units by mouth at bedtime.    [provider]  docusate sodium (COLACE) 100 MG capsule Take 1 capsule (100 mg total) by mouth 2 (two) times daily. To keep stools soft 12/15/19   Benjaman Kindler, MD  ferrous sulfate 325 (65 FE) MG tablet Take 325 mg by mouth daily.    [provider]  furosemide (LASIX) 20 MG tablet Take 20-40 mg by mouth See admin instructions. Take 1 tablet (20 mg) by mouth every morning & take 1 tablet (20 mg) by mouth every other day in the evening. 04/19/17   [provider]  gabapentin (NEURONTIN) 800 MG tablet Take 1 tablet (800 mg total) by mouth at bedtime for 14 days. Take nightly for 3 days, then up to 14 days as needed 12/15/19 12/29/19  Benjaman Kindler, MD  hydrochlorothiazide (HYDRODIURIL) 25 MG tablet Take 25 mg by mouth 2 (two) times daily. 11/01/16   [provider]  levocetirizine (XYZAL) 5 MG tablet Take 5 mg by mouth at bedtime.  11/11/19   [provider]  lisinopril (ZESTRIL) 20 MG tablet Take 20 mg by  mouth every evening.  10/11/19   [provider]  omeprazole (PRILOSEC) 40 MG capsule Take 40 mg by mouth in the morning and at bedtime.  09/01/16 12/01/21  [provider]  ondansetron (ZOFRAN ODT) 4 MG disintegrating tablet Take 1 tablet (4 mg total) by mouth every 6 (six) hours as needed for nausea. 12/15/19   Benjaman Kindler, MD  oxyCODONE (OXY IR/ROXICODONE) 5 MG immediate release tablet Take 1 tablet (5 mg total) by mouth every 4 (four) hours as needed for severe pain. 12/15/19   Benjaman Kindler, MD  pantoprazole (PROTONIX) 40 MG tablet Take 1  tablet (40 mg total) by mouth daily. While on tramadol pain meds 12/15/19 12/14/20  Benjaman Kindler, MD  Potassium 99 MG TABS Take 99 mg by mouth daily.    [provider]  pyridOXINE (VITAMIN B-6) 100 MG tablet Take 100 mg by mouth at bedtime.    [provider]  sertraline (ZOLOFT) 50 MG tablet Take 50 mg by mouth every evening.  11/23/16   [provider]  simvastatin (ZOCOR) 20 MG tablet Take 20 mg by mouth every evening.  02/20/17 12/01/20  [provider]  traMADol (ULTRAM) 50 MG tablet Take 1 tablet (50 mg total) by mouth every 6 (six) hours as needed. 12/15/19 12/14/20  Benjaman Kindler, MD  vitamin B-12 (CYANOCOBALAMIN) 1000 MCG tablet Take 1,000 mcg by mouth daily.    [provider]    Allergies Anti-inflammatory enzyme [nutritional supplements], Codeine, and Penicillin g  Family History  Problem Relation Age of Onset  . Breast cancer Mother 77       x 2 times  . Ovarian cancer Mother   . Breast cancer Maternal Aunt   . Ovarian cancer Maternal Aunt   . Breast cancer Maternal Aunt     Social History Social History   Tobacco Use  . Smoking status: Never Smoker  . Smokeless tobacco: Never Used  Vaping Use  . Vaping Use: Never used  Substance Use Topics  . Alcohol use: Never  . Drug use: Never    Review of Systems Constitutional: Fevers chills fatigue and aches Eyes: No visual changes. ENT: No sore throat.  Runny nose Cardiovascular: Denies chest pain. Respiratory: Mild feeling shortness of breath Gastrointestinal: See HPI Genitourinary: Negative for dysuria.  Urine has been darker than normal Musculoskeletal: Negative for back pain. Skin: Negative for rash. Neurological: Negative for areas of focal weakness or numbness.    ____________________________________________   PHYSICAL EXAM:  VITAL SIGNS: ED Triage Vitals  Enc Vitals Group     BP 03/31/20 0354 (!) 102/58     Pulse Rate 03/31/20 0354 85     Resp 03/31/20  0354 18     Temp 03/31/20 0354 97.8 F (36.6 C)     Temp Source 03/31/20 0354 Oral     SpO2 03/31/20 0354 98 %     Weight 03/31/20 0352 200 lb (90.7 kg)     Height 03/31/20 0352 5\' 5"  (1.651 m)     Head Circumference --      Peak Flow --      Pain Score 03/31/20 0352 8     Pain Loc --      Pain Edu? --      Excl. in [place]? --     Constitutional: Alert and oriented.  Mildly ill in appearance, fatigued nauseated holding emesis basin but not actively vomiting Eyes: Conjunctivae are normal. Head: Atraumatic. Nose: No congestion/rhinnorhea. Mouth/Throat: Mucous membranes are moist.  Neck: No stridor.  Cardiovascular: Normal rate, regular rhythm. Grossly normal heart sounds.  Good peripheral circulation. Respiratory: Normal respiratory effort.  No retractions. Lungs CTAB. Gastrointestinal: Soft and nontender except in the epigastrium where she reports moderate tenderness to palpation. No distention. Musculoskeletal: No lower extremity tenderness nor edema. Neurologic:  Normal speech and language. No gross focal neurologic deficits are appreciated.  Skin:  Skin is warm, dry and intact. No rash noted. Psychiatric: Mood and affect are normal. Speech and behavior are normal.  ____________________________________________   LABS (all labs ordered are listed, but only abnormal results are displayed)  Labs Reviewed  SARS CORONAVIRUS 2 BY RT PCR ([place], [place] [facility]) - Abnormal; Notable for the following components:      Result Value   SARS Coronavirus 2 POSITIVE (*)    All other components within normal limits  CBC WITH DIFFERENTIAL/PLATELET - Abnormal; Notable for the following components:   RBC 5.49 (*)    MCV 75.6 (*)    MCH 25.9 (*)    RDW 15.9 (*)    All other components within normal limits  COMPREHENSIVE METABOLIC PANEL - Abnormal; Notable for the following components:   Sodium 132 (*)    Potassium 2.4 (*)    Chloride 91 (*)    Glucose, Bld 134  (*)    Calcium 8.8 (*)    AST 72 (*)    ALT 100 (*)    All other components within normal limits  URINALYSIS, COMPLETE (UACMP) WITH MICROSCOPIC - Abnormal; Notable for the following components:   Color, Urine YELLOW (*)    APPearance HAZY (*)    Protein, ur 100 (*)    All other components within normal limits  LIPASE, BLOOD  MAGNESIUM  MONONUCLEOSIS SCREEN   ____________________________________________  EKG  Reviewed inter by me at 1055 Heart rate 80 QRS 120 QTc 500 Normal sinus rhythm, minimal T wave flattening, slight artifact.  No evidence of acute ischemia denoted ____________________________________________  RADIOLOGY  CT ABDOMEN PELVIS W CONTRAST  Result Date: 03/31/2020 CLINICAL DATA:  Right upper quadrant pain for several days EXAM: CT ABDOMEN AND PELVIS WITH CONTRAST TECHNIQUE: Multidetector CT imaging of the abdomen and pelvis was performed using the standard protocol following bolus administration of intravenous contrast. CONTRAST:  167mL OMNIPAQUE IOHEXOL 300 MG/ML  SOLN COMPARISON:  None. FINDINGS: Lower chest: Lung bases demonstrate some patchy ground-glass opacities in the right middle and lower lobes correlate with current COVID-19 testing. Hepatobiliary: Fatty infiltration of the liver is noted. The gallbladder is within normal limits. Pancreas: Unremarkable. No pancreatic ductal dilatation or surrounding inflammatory changes. Spleen: Normal in size without focal abnormality. Adrenals/Urinary Tract: Adrenal glands are within normal limits. Kidneys demonstrate a normal enhancement pattern bilaterally. No renal calculi are noted. Mild hypertrophy of the right kidney is seen. Scarring in the left kidney is noted. The bladder is partially distended. Stomach/Bowel: Appendix is within normal limits. Colon shows no obstructive or inflammatory changes. Small bowel and stomach are unremarkable. Vascular/Lymphatic: Aortic atherosclerosis. No enlarged abdominal or pelvic lymph  nodes. Reproductive: Uterus has been surgically removed. Mildly prominent left ovary is noted with a cyst within measuring 2 cm Other: No abdominal wall hernia or abnormality. No abdominopelvic ascites. Musculoskeletal: No acute or significant osseous findings. IMPRESSION: Patchy ground-glass opacities in the right lung base. Correlate with pending COVID-19 testing. Fatty liver. Scarring in the left kidney with mild right renal hypertrophy. No obstructive changes are noted. Left ovarian cyst measuring 2 cm. Given  the patient's age this is likely physiologic in nature. Electronically Signed   By: Inez Catalina M.D.   On: 03/31/2020 11:31    CT reviewed, concerning for groundglass opacities right lung base.  Fatty liver. ____________________________________________   PROCEDURES  Procedure(s) performed: None  Procedures  Critical Care performed: No  ____________________________________________   INITIAL IMPRESSION / ASSESSMENT AND PLAN / ED COURSE  Pertinent labs & imaging results that were available during my care of the patient were reviewed by me and considered in my medical decision making (see chart for details).   Patient tested positive for COVID-19.  Given her clinical symptomatology and history I suspect this is likely etiology for her presentation symptoms today.  EKG demonstrates mild prolongation of QT, after repleting potassium will give a dose of Zofran.  IV fluids.  Patient will need rehydration, potassium repletion given the severity of her hypokalemia and mild prolongation of her QT interval will admit to the hospital for further treatment.  Discussed case with Dr. Bobbye Charleston, [facility] [place].  Patient is understanding and agreeable with plan.  Her husband is fully vaccinated, she does live with 2 other people who are not vaccinated and has had close contact with her, encouraged her to notify close contact so they may reach out to the health department for further recommendations.         ____________________________________________   FINAL CLINICAL IMPRESSION(S) / ED DIAGNOSES  Final diagnoses:  COVID-19  Hypokalemia        Note:  This document was prepared using Dragon voice recognition software and may include unintentional dictation errors       Delman Kitten, MD 03/31/20 1238

## 2020-03-31 NOTE — H&P (Signed)
[facility] NAME: Natalie Strickland    MR#:  [id number]  DATE OF BIRTH:  1971/04/13  DATE OF ADMISSION:  03/31/2020  PRIMARY CARE PHYSICIAN: Idelle Crouch, MD   REQUESTING/REFERRING PHYSICIAN: Dr Delman Kitten  CHIEF COMPLAINT:   Chief Complaint  Patient presents with  . Emesis    HISTORY OF PRESENT ILLNESS:  Natalie Strickland  is a 49 y.o. female coming in with nausea vomiting and diarrhea and unable to keep anything down for a few days.  Patient does have a cough and shortness of breath mostly at night.  Bringing up yellow sputum.  Some soreness in her joints.  No runny nose or postnasal drip.  She has had some fever and chills at home.  The patient has received her first Covid vaccination last week.  In the ER she was found to be Covid positive and have severe hypokalemia.  Hospitalist services were contacted for further evaluation.  PAST MEDICAL HISTORY:   Past Medical History:  Diagnosis Date  . Anemia    low iron and vitamin b levels  . Anxiety   . Depression   . Dyspnea   . GERD (gastroesophageal reflux disease)   . Headache    dt anemia  . History of hiatal hernia   . History of kidney stones   . Hypercholesteremia   . Hypertension   . IBS (irritable bowel syndrome)   . Lymphocytic colitis     PAST SURGICAL HISTORY:   Past Surgical History:  Procedure Laterality Date  . CARPAL TUNNEL RELEASE Right   . COLONOSCOPY    . CYSTOSCOPY  12/15/2019   Procedure: CYSTOSCOPY;  Surgeon: Benjaman Kindler, MD;  Location: [facility] ORS;  Service: Gynecology;;  . [place]  2005  . LAPAROSCOPIC BILATERAL SALPINGECTOMY Bilateral 12/15/2019   Procedure: LAPAROSCOPIC BILATERAL SALPINGECTOMY;  Surgeon: Benjaman Kindler, MD;  Location: [facility] ORS;  Service: Gynecology;  Laterality: Bilateral;  . LAPAROSCOPIC HYSTERECTOMY N/A 12/15/2019   Procedure: HYSTERECTOMY TOTAL LAPAROSCOPIC;  Surgeon: Benjaman Kindler, MD;  Location: [facility] ORS;   Service: Gynecology;  Laterality: N/A;  REQUESTING RNFA  . TUBAL LIGATION  1990  . WISDOM TOOTH EXTRACTION      SOCIAL HISTORY:   Social History   Tobacco Use  . Smoking status: Never Smoker  . Smokeless tobacco: Never Used  Substance Use Topics  . Alcohol use: Never    FAMILY HISTORY:   Family History  Problem Relation Age of Onset  . Breast cancer Mother 51       x 2 times  . Ovarian cancer Mother   . Hypertension Mother   . Heart failure Mother   . Breast cancer Maternal Aunt   . Ovarian cancer Maternal Aunt   . Breast cancer Maternal Aunt   . Heart failure Father   . Hypertension Father     DRUG ALLERGIES:   Allergies  Allergen Reactions  . Anti-Inflammatory Enzyme [Nutritional Supplements] Other (See Comments)    History of lymphocytic colitis. Messes up her GI system  . Codeine Rash  . Penicillin G Hives    Has patient had a PCN reaction causing immediate rash, facial/tongue/throat swelling, SOB or lightheadedness with hypotension: No Has patient had a PCN reaction causing severe rash involving mucus membranes or skin necrosis: No Has patient had a PCN reaction that required hospitalization: No Has patient had a PCN reaction occurring within the last 10 years: No If all of the above answers are "NO",  then may proceed with Cephalosporin use.     REVIEW OF SYSTEMS:  CONSTITUTIONAL: Positive for fever, chills, fatigue, weight loss EYES: Wears glasses EARS, NOSE, AND THROAT: No tinnitus or ear pain. No sore throat RESPIRATORY: Some cough, some shortness of breath. CARDIOVASCULAR: No chest pain.  GASTROINTESTINAL: Positive for nausea, vomiting, diarrhea and epigastric abdominal pain. No blood in bowel movements GENITOURINARY: No dysuria, hematuria.  ENDOCRINE: No polyuria, nocturia,  HEMATOLOGY: No anemia, easy bruising or bleeding SKIN: No rash or lesion. MUSCULOSKELETAL: Positive some soreness in the joints NEUROLOGIC: No tingling, numbness, weakness.   PSYCHIATRY: History of anxiety and depression.   MEDICATIONS AT HOME:   Prior to Admission medications   Medication Sig Start Date End Date Taking? Authorizing Provider  busPIRone (BUSPAR) 15 MG tablet Take 30 mg by mouth 2 (two) times daily.  11/08/19  Yes [provider]  furosemide (LASIX) 20 MG tablet Take 20-40 mg by mouth See admin instructions. Take 1 tablet (20 mg) by mouth every morning & take 1 tablet (20 mg) by mouth every other day in the evening. 04/19/17  Yes [provider]  hydrochlorothiazide (HYDRODIURIL) 25 MG tablet Take 25 mg by mouth 2 (two) times daily. 11/01/16  Yes [provider]  levocetirizine (XYZAL) 5 MG tablet Take 5 mg by mouth at bedtime.  11/11/19  Yes [provider]  lisinopril (ZESTRIL) 20 MG tablet Take 20 mg by mouth every evening.  10/11/19  Yes [provider]  omeprazole (PRILOSEC) 40 MG capsule Take 40 mg by mouth in the morning and at bedtime.  09/01/16 12/01/21 Yes [provider]  pantoprazole (PROTONIX) 40 MG tablet Take 1 tablet (40 mg total) by mouth daily. While on tramadol pain meds 12/15/19 12/14/20 Yes Benjaman Kindler, MD  sertraline (ZOLOFT) 50 MG tablet Take 50 mg by mouth every evening.  11/23/16  Yes [provider]  simvastatin (ZOCOR) 20 MG tablet Take 20 mg by mouth every evening.  02/20/17 12/01/20 Yes [provider]  Cholecalciferol (VITAMIN D3) 50 MCG (2000 UT) TABS Take 2,000 Units by mouth at bedtime.    [provider]  docusate sodium (COLACE) 100 MG capsule Take 1 capsule (100 mg total) by mouth 2 (two) times daily. To keep stools soft 12/15/19   Benjaman Kindler, MD  ferrous sulfate 325 (65 FE) MG tablet Take 325 mg by mouth daily.    [provider]  gabapentin (NEURONTIN) 800 MG tablet Take 1 tablet (800 mg total) by mouth at bedtime for 14 days. Take nightly for 3 days, then up to 14 days as needed 12/15/19 12/29/19  Benjaman Kindler, MD  ondansetron  (ZOFRAN ODT) 4 MG disintegrating tablet Take 1 tablet (4 mg total) by mouth every 6 (six) hours as needed for nausea. 12/15/19   Benjaman Kindler, MD  Potassium 99 MG TABS Take 99 mg by mouth daily.    [provider]  pyridOXINE (VITAMIN B-6) 100 MG tablet Take 100 mg by mouth at bedtime.    [provider]  vitamin B-12 (CYANOCOBALAMIN) 1000 MCG tablet Take 1,000 mcg by mouth daily.    [provider]   Medication reconciliation still undergoing.  I did review Dr. Doy Hutching medication reconciliation.  VITAL SIGNS:  Blood pressure 127/82, pulse 78, temperature 98.6 F (37 C), temperature source Oral, resp. rate 18, height 5\' 5"  (1.651 m), weight 90.7 kg, last menstrual period 11/04/2019, SpO2 100 %.  PHYSICAL EXAMINATION:  GENERAL:  49 y.o.-year-old patient lying in the  bed with no acute distress.  EYES: Pupils equal, round, reactive to light and accommodation. No scleral icterus. Extraocular muscles intact.  HEENT: Head atraumatic, normocephalic. Oropharynx and nasopharynx clear.  NECK:  Supple, no jugular venous distention. No thyroid enlargement, no tenderness.  LUNGS: Normal breath sounds bilaterally, no wheezing, rales,rhonchi or crepitation. No use of accessory muscles of respiration.  CARDIOVASCULAR: S1, S2 normal. No murmurs, rubs, or gallops.  ABDOMEN: Soft, epigastric tenderness, nondistended. Bowel sounds present. No organomegaly or mass.  EXTREMITIES: No pedal edema, cyanosis, or clubbing.  NEUROLOGIC: Cranial nerves II through XII are intact. Muscle strength 5/5 in all extremities. Sensation intact. Gait not checked.  PSYCHIATRIC: The patient is alert and oriented x 3.  SKIN: No rash, lesion, or ulcer.   LABORATORY PANEL:   CBC Recent Labs  Lab 03/31/20 0355  WBC 6.7  HGB 14.2  HCT 41.5  PLT 280   ------------------------------------------------------------------------------------------------------------------  Chemistries  Recent Labs  Lab  03/31/20 0355  NA 132*  K 2.4*  CL 91*  CO2 28  GLUCOSE 134*  BUN 12  CREATININE 0.87  CALCIUM 8.8*  MG 1.9  AST 72*  ALT 100*  ALKPHOS 69  BILITOT 0.8   ------------------------------------------------------------------------------------------------------------------   RADIOLOGY:  CT ABDOMEN PELVIS W CONTRAST  Result Date: 03/31/2020 CLINICAL DATA:  Right upper quadrant pain for several days EXAM: CT ABDOMEN AND PELVIS WITH CONTRAST TECHNIQUE: Multidetector CT imaging of the abdomen and pelvis was performed using the standard protocol following bolus administration of intravenous contrast. CONTRAST:  170mL OMNIPAQUE IOHEXOL 300 MG/ML  SOLN COMPARISON:  None. FINDINGS: Lower chest: Lung bases demonstrate some patchy ground-glass opacities in the right middle and lower lobes correlate with current COVID-19 testing. Hepatobiliary: Fatty infiltration of the liver is noted. The gallbladder is within normal limits. Pancreas: Unremarkable. No pancreatic ductal dilatation or surrounding inflammatory changes. Spleen: Normal in size without focal abnormality. Adrenals/Urinary Tract: Adrenal glands are within normal limits. Kidneys demonstrate a normal enhancement pattern bilaterally. No renal calculi are noted. Mild hypertrophy of the right kidney is seen. Scarring in the left kidney is noted. The bladder is partially distended. Stomach/Bowel: Appendix is within normal limits. Colon shows no obstructive or inflammatory changes. Small bowel and stomach are unremarkable. Vascular/Lymphatic: Aortic atherosclerosis. No enlarged abdominal or pelvic lymph nodes. Reproductive: Uterus has been surgically removed. Mildly prominent left ovary is noted with a cyst within measuring 2 cm Other: No abdominal wall hernia or abnormality. No abdominopelvic ascites. Musculoskeletal: No acute or significant osseous findings. IMPRESSION: Patchy ground-glass opacities in the right lung base. Correlate with pending COVID-19  testing. Fatty liver. Scarring in the left kidney with mild right renal hypertrophy. No obstructive changes are noted. Left ovarian cyst measuring 2 cm. Given the patient's age this is likely physiologic in nature. Electronically Signed   By: Inez Catalina M.D.   On: 03/31/2020 11:31    EKG:   Sinus rhythm 79 bpm, intraventricular conduction delay   IMPRESSION AND PLAN:   1.  Severe hypokalemia with GI losses secondary to COVID-19.  Replace potassium and IV fluids and IV runs.  Recheck potassium tomorrow morning.  Telemetry monitoring.  Clear liquid diet for now. 2.  COVID-19 pneumonia.  Will start remdesivir and Solu-Medrol.  We will add vitamin C and zinc.  Patient currently not requiring oxygen.  Patient can potentially finish 5-day remdesivir treatment at the [facility] if feeling better prior to the 5 days here in the hospital. 3.  Essential hypertension continue lisinopril  4.  Hyperlipidemia unspecified on Zocor 5.  GERD and epigastric pain we will switch omeprazole over to IV Protonix 6.  Anxiety depression continue BuSpar and Zoloft  All the records, laboratory and radiological data are reviewed and case discussed with ED provider. Management plans discussed with the patient, family and they are in agreement.  Patient meets inpatient criteria with severe hypokalemia.  Also has COVID-19 pneumonia and will start remdesivir treatment.  CODE STATUS: Full Code  TOTAL TIME TAKING CARE OF THIS PATIENT: 50 minutes.    Loletha Grayer M.D on 03/31/2020 at 1:04 PM  Between 7am to 6pm - Pager - [phone]  After 6pm call admission pager [phone]  [facility]  CC: Primary care physician; Idelle Crouch, MD

## 2020-04-01 DIAGNOSIS — F419 Anxiety disorder, unspecified: Secondary | ICD-10-CM

## 2020-04-01 DIAGNOSIS — J1282 Pneumonia due to coronavirus disease 2019: Secondary | ICD-10-CM

## 2020-04-01 DIAGNOSIS — E876 Hypokalemia: Secondary | ICD-10-CM

## 2020-04-01 DIAGNOSIS — U071 COVID-19: Principal | ICD-10-CM

## 2020-04-01 DIAGNOSIS — F329 Major depressive disorder, single episode, unspecified: Secondary | ICD-10-CM

## 2020-04-01 LAB — CBC WITH DIFFERENTIAL/PLATELET
Abs Immature Granulocytes: 0.04 10*3/uL (ref 0.00–0.07)
Basophils Absolute: 0 10*3/uL (ref 0.0–0.1)
Basophils Relative: 0 %
Eosinophils Absolute: 0 10*3/uL (ref 0.0–0.5)
Eosinophils Relative: 0 %
HCT: 36.9 % (ref 36.0–46.0)
Hemoglobin: 12.5 g/dL (ref 12.0–15.0)
Immature Granulocytes: 1 %
Lymphocytes Relative: 30 %
Lymphs Abs: 1.6 10*3/uL (ref 0.7–4.0)
MCH: 26.4 pg (ref 26.0–34.0)
MCHC: 33.9 g/dL (ref 30.0–36.0)
MCV: 77.8 fL — ABNORMAL LOW (ref 80.0–100.0)
Monocytes Absolute: 0.5 10*3/uL (ref 0.1–1.0)
Monocytes Relative: 9 %
Neutro Abs: 3.3 10*3/uL (ref 1.7–7.7)
Neutrophils Relative %: 60 %
Platelets: 263 10*3/uL (ref 150–400)
RBC: 4.74 MIL/uL (ref 3.87–5.11)
RDW: 16.8 % — ABNORMAL HIGH (ref 11.5–15.5)
WBC: 5.4 10*3/uL (ref 4.0–10.5)
nRBC: 0 % (ref 0.0–0.2)

## 2020-04-01 LAB — C-REACTIVE PROTEIN: CRP: 0.9 mg/dL

## 2020-04-01 LAB — COMPREHENSIVE METABOLIC PANEL WITH GFR
ALT: 70 U/L — ABNORMAL HIGH (ref 0–44)
AST: 43 U/L — ABNORMAL HIGH (ref 15–41)
Albumin: 3.5 g/dL (ref 3.5–5.0)
Alkaline Phosphatase: 61 U/L (ref 38–126)
Anion gap: 7 (ref 5–15)
BUN: 9 mg/dL (ref 6–20)
CO2: 27 mmol/L (ref 22–32)
Calcium: 8.1 mg/dL — ABNORMAL LOW (ref 8.9–10.3)
Chloride: 104 mmol/L (ref 98–111)
Creatinine, Ser: 0.67 mg/dL (ref 0.44–1.00)
GFR calc Af Amer: >60 mL/min
GFR calc non Af Amer: >60 mL/min
Glucose, Bld: 125 mg/dL — ABNORMAL HIGH (ref 70–99)
Potassium: 3.7 mmol/L (ref 3.5–5.1)
Sodium: 138 mmol/L (ref 135–145)
Total Bilirubin: 0.6 mg/dL (ref 0.3–1.2)
Total Protein: 6.7 g/dL (ref 6.5–8.1)

## 2020-04-01 LAB — FERRITIN: Ferritin: 285 ng/mL (ref 11–307)

## 2020-04-01 LAB — PHOSPHORUS: Phosphorus: 2.7 mg/dL (ref 2.5–4.6)

## 2020-04-01 LAB — FIBRIN DERIVATIVES D-DIMER (ARMC ONLY): Fibrin derivatives D-dimer (ARMC): 598.87 ng/mL (FEU) — ABNORMAL HIGH (ref 0.00–499.00)

## 2020-04-01 LAB — MAGNESIUM: Magnesium: 2.1 mg/dL (ref 1.7–2.4)

## 2020-04-01 MED ORDER — ADULT MULTIVITAMIN W/MINERALS CH
1.0000 | ORAL_TABLET | Freq: Every day | ORAL | Status: DC
  Administered 2020-04-02: 1 via ORAL
  Filled 2020-04-01: qty 1

## 2020-04-01 MED ORDER — TRAMADOL HCL 50 MG PO TABS
50.0000 mg | ORAL_TABLET | Freq: Once | ORAL | Status: AC
  Administered 2020-04-01: 50 mg via ORAL
  Filled 2020-04-01: qty 1

## 2020-04-01 MED ORDER — BOOST / RESOURCE BREEZE PO LIQD CUSTOM
1.0000 | Freq: Three times a day (TID) | ORAL | Status: DC
  Administered 2020-04-01 – 2020-04-02 (×3): 1 via ORAL

## 2020-04-01 NOTE — Progress Notes (Signed)
Pt reports that she has not had any bowel movements since 1 am today, will cont to monitor, Pt has not had any episodes of nausea or vomiting since the start of my shift. Tolerating CLD without any issues

## 2020-04-01 NOTE — Progress Notes (Signed)
Initial Nutrition Assessment  DOCUMENTATION CODES:   Obesity unspecified  INTERVENTION:   Boost Breeze po TID, each supplement provides 250 kcal and 9 grams of protein  MVI daily   Ensure Enlive po TID with diet advancement, each supplement provides 350 kcal and 20 grams of protein  Pt at high refeed risk; recommend monitor K, Mg and P labs daily as oral intake improved.   NUTRITION DIAGNOSIS:   Increased nutrient needs related to catabolic illness (COVID 19) as evidenced by increased estimated needs.  GOAL:   Patient will meet greater than or equal to 90% of their needs  MONITOR:   PO intake, Supplement acceptance, Labs, Weight trends, Skin, I & O's  REASON FOR ASSESSMENT:   Malnutrition Screening Tool    ASSESSMENT:   49 y/o female with h/o IBS, GERD, HLD, HTN, anemia, anxiety and depression who is admitted with COVID 19.  RD working remotely.  Spoke with pt via phone. Pt reports decreased appetite and oral intake for the past week r/t nausea, vomiting and diarrhea. Pt reports that she has been on clear liquids for 1 week. Pt reports that she is tolerating her clear liquids ok in hospital. Pt reports a 10lb weight loss over the past week; this is significant. Per chart, pt is down 14lbs(7%) since April. RD discussed with pt the importance of adequate nutrition needed to preserve lean muscle. Pt is willing to drink peach Boost Breeze and then chocolate Ensure with diet advancement. Pt is at high refeed risk.   Pt at high risk for developing severe acute malnutrition.    Medications reviewed and include: ascorbic acid, dexamethasone, lovenox, protonix, zinc sulfate, NaCl w/ KCl @75ml /hr  Labs reviewed: K 3.7 wnl, P 2.7 wnl, Mg 2.1 wnl  NUTRITION - FOCUSED PHYSICAL EXAM: Unable to perform at this time   Diet Order:   Diet Order            Diet clear liquid Room service appropriate? Yes; Fluid consistency: Thin  Diet effective now                EDUCATION  NEEDS:   Education needs have been addressed  Skin:  Skin Assessment: Reviewed RN Assessment  Last BM:  PTA  Height:   Ht Readings from Last 1 Encounters:  03/31/20 5\' 5"  (1.651 m)    Weight:   Wt Readings from Last 1 Encounters:  03/31/20 90.7 kg    Ideal Body Weight:  56.8 kg  BMI:  Body mass index is 33.28 kg/m.  Estimated Nutritional Needs:   Kcal:  2200-2500kcal/day  Protein:  110-125g/day  Fluid:  1.7-2L/day  Koleen Distance MS, RD, LDN Please refer to [facility] for RD and/or RD on-call/weekend/after hours pager

## 2020-04-01 NOTE — Progress Notes (Signed)
[place] [facility] [place] NAME: Natalie Strickland    MR#:  [id number]  DATE OF BIRTH:  1971-06-12  SUBJECTIVE:  CHIEF COMPLAINT:   Chief Complaint  Patient presents with  . Emesis  anxious. Breathing better REVIEW OF SYSTEMS:  Review of Systems  Constitutional: Positive for malaise/fatigue. Negative for diaphoresis, fever and weight loss.  HENT: Negative for ear discharge, ear pain, hearing loss, nosebleeds, sore throat and tinnitus.   Eyes: Negative for blurred vision and pain.  Respiratory: Positive for shortness of breath. Negative for cough, hemoptysis and wheezing.   Cardiovascular: Negative for chest pain, palpitations, orthopnea and leg swelling.  Gastrointestinal: Positive for nausea. Negative for abdominal pain, blood in stool, constipation, diarrhea, heartburn and vomiting.  Genitourinary: Negative for dysuria, frequency and urgency.  Musculoskeletal: Negative for back pain and myalgias.  Skin: Negative for itching and rash.  Neurological: Negative for dizziness, tingling, tremors, focal weakness, seizures, weakness and headaches.  Psychiatric/Behavioral: Negative for depression. The patient is not nervous/anxious.    DRUG ALLERGIES:   Allergies  Allergen Reactions  . Anti-Inflammatory Enzyme [Nutritional Supplements] Other (See Comments)    History of lymphocytic colitis. Messes up her GI system  . Codeine Rash  . Penicillin G Hives    Has patient had a PCN reaction causing immediate rash, facial/tongue/throat swelling, SOB or lightheadedness with hypotension: No Has patient had a PCN reaction causing severe rash involving mucus membranes or skin necrosis: No Has patient had a PCN reaction that required hospitalization: No Has patient had a PCN reaction occurring within the last 10 years: No If all of the above answers are "NO", then may proceed with Cephalosporin use.    VITALS:  Blood pressure 105/68, pulse 65, temperature 97.7 F (36.5  C), temperature source Oral, resp. rate 18, height 5\' 5"  (1.651 m), weight 90.7 kg, last menstrual period 11/04/2019, SpO2 95 %. PHYSICAL EXAMINATION:  Physical Exam HENT:     Head: Normocephalic and atraumatic.  Eyes:     Conjunctiva/sclera: Conjunctivae normal.     Pupils: Pupils are equal, round, and reactive to light.  Neck:     Thyroid: No thyromegaly.     Trachea: No tracheal deviation.  Cardiovascular:     Rate and Rhythm: Normal rate and regular rhythm.     Heart sounds: Normal heart sounds.  Pulmonary:     Effort: Pulmonary effort is normal. No respiratory distress.     Breath sounds: Normal breath sounds. No wheezing.  Chest:     Chest wall: No tenderness.  Abdominal:     General: Bowel sounds are normal. There is no distension.     Palpations: Abdomen is soft.     Tenderness: There is no abdominal tenderness.  Musculoskeletal:        General: Normal range of motion.     Cervical back: Normal range of motion and neck supple.  Skin:    General: Skin is warm and dry.     Findings: No rash.  Neurological:     Mental Status: She is alert and oriented to person, place, and time.     Cranial Nerves: No cranial nerve deficit.    LABORATORY PANEL:  Female CBC Recent Labs  Lab 04/01/20 0416  WBC 5.4  HGB 12.5  HCT 36.9  PLT 263   ------------------------------------------------------------------------------------------------------------------ Chemistries  Recent Labs  Lab 04/01/20 0416  NA 138  K 3.7  CL 104  CO2 27  GLUCOSE 125*  BUN 9  CREATININE 0.67  CALCIUM 8.1*  MG 2.1  AST 43*  ALT 70*  ALKPHOS 61  BILITOT 0.6   RADIOLOGY:  No results found. ASSESSMENT AND PLAN:  85 y f admitted for covid pna  * COVID-19 pneumonia - continue remdesivir and Solu-Medrol day 2/5.  - continue vitamin C and zinc.  Patient currently not requiring oxygen. - trend inflammatory markers  * Hypokalemia with GI losses secondary to COVID-19 - repleted and  resolved  * Essential hypertension continue lisinopril  * Hyperlipidemia - on Zocor  * GERD and epigastric pain - Protonix  * Anxiety/depression - continue BuSpar and Zoloft  * Obesity Body mass index is 33.28 kg/m. Counseled for weight loss     Status is: Inpatient  Remains inpatient appropriate because:Inpatient level of care appropriate due to severity of illness   Dispo: The patient is from: Home              Anticipated d/c is to: Home              Anticipated d/c date is: 3 days              Patient currently is not medically stable to d/c. waiting for 3 more days of IV remdesevir   DVT prophylaxis:            enoxaparin (LOVENOX) injection 40 mg Start: 03/31/20 1600     Family Communication: discussed with patient   All the records are reviewed and case discussed with Care Management/Social Worker. Management plans discussed with the patient, nursing and they are in agreement.  CODE STATUS: Full Code  TOTAL TIME TAKING CARE OF THIS PATIENT: 25 minutes.   More than 50% of the time was spent in counseling/coordination of care: YES  POSSIBLE D/C IN 3 DAYS, DEPENDING ON CLINICAL CONDITION.   Max Sane M.D on 04/01/2020 at 1:09 PM  [facility]   CC: Primary care physician; Idelle Crouch, MD  Note: This dictation was prepared with Dragon dictation along with smaller phrase technology. Any transcriptional errors that result from this process are unintentional.

## 2020-04-02 LAB — CBC WITH DIFFERENTIAL/PLATELET
Abs Immature Granulocytes: 0.06 10*3/uL (ref 0.00–0.07)
Basophils Absolute: 0 10*3/uL (ref 0.0–0.1)
Basophils Relative: 0 %
Eosinophils Absolute: 0 10*3/uL (ref 0.0–0.5)
Eosinophils Relative: 0 %
HCT: 35.2 % — ABNORMAL LOW (ref 36.0–46.0)
Hemoglobin: 12 g/dL (ref 12.0–15.0)
Immature Granulocytes: 1 %
Lymphocytes Relative: 34 %
Lymphs Abs: 2.5 10*3/uL (ref 0.7–4.0)
MCH: 26.3 pg (ref 26.0–34.0)
MCHC: 34.1 g/dL (ref 30.0–36.0)
MCV: 77.2 fL — ABNORMAL LOW (ref 80.0–100.0)
Monocytes Absolute: 0.4 10*3/uL (ref 0.1–1.0)
Monocytes Relative: 6 %
Neutro Abs: 4.4 10*3/uL (ref 1.7–7.7)
Neutrophils Relative %: 59 %
Platelets: 295 10*3/uL (ref 150–400)
RBC: 4.56 MIL/uL (ref 3.87–5.11)
RDW: 17.6 % — ABNORMAL HIGH (ref 11.5–15.5)
Smear Review: NORMAL
WBC: 7.5 10*3/uL (ref 4.0–10.5)
nRBC: 0 % (ref 0.0–0.2)

## 2020-04-02 LAB — COMPREHENSIVE METABOLIC PANEL
ALT: 59 U/L — ABNORMAL HIGH (ref 0–44)
AST: 31 U/L (ref 15–41)
Albumin: 3.3 g/dL — ABNORMAL LOW (ref 3.5–5.0)
Alkaline Phosphatase: 52 U/L (ref 38–126)
Anion gap: 8 (ref 5–15)
BUN: 10 mg/dL (ref 6–20)
CO2: 24 mmol/L (ref 22–32)
Calcium: 7.7 mg/dL — ABNORMAL LOW (ref 8.9–10.3)
Chloride: 109 mmol/L (ref 98–111)
Creatinine, Ser: 0.81 mg/dL (ref 0.44–1.00)
GFR calc Af Amer: >60 mL/min (ref >60)
GFR calc non Af Amer: >60 mL/min (ref >60)
Glucose, Bld: 98 mg/dL (ref 70–99)
Potassium: 3.1 mmol/L — ABNORMAL LOW (ref 3.5–5.1)
Sodium: 141 mmol/L (ref 135–145)
Total Bilirubin: 0.5 mg/dL (ref 0.3–1.2)
Total Protein: 6.1 g/dL — ABNORMAL LOW (ref 6.5–8.1)

## 2020-04-02 LAB — MAGNESIUM: Magnesium: 2.1 mg/dL (ref 1.7–2.4)

## 2020-04-02 LAB — FERRITIN: Ferritin: 236 ng/mL (ref 11–307)

## 2020-04-02 LAB — PHOSPHORUS: Phosphorus: 2.7 mg/dL (ref 2.5–4.6)

## 2020-04-02 LAB — FIBRIN DERIVATIVES D-DIMER (ARMC ONLY): Fibrin derivatives D-dimer (ARMC): 469.75 ng/mL (FEU) (ref 0.00–499.00)

## 2020-04-02 LAB — C-REACTIVE PROTEIN: CRP: 0.7 mg/dL (ref <1.0)

## 2020-04-02 MED ORDER — ZINC SULFATE 220 (50 ZN) MG PO CAPS: 220.0000 mg | ORAL_CAPSULE | Freq: Every day | ORAL | 0 refills | Status: AC

## 2020-04-02 MED ORDER — SENNOSIDES-DOCUSATE SODIUM 8.6-50 MG PO TABS
2.0000 | ORAL_TABLET | Freq: Two times a day (BID) | ORAL | Status: DC
  Administered 2020-04-02: 09:00:00 2 via ORAL
  Filled 2020-04-02: qty 2

## 2020-04-02 MED ORDER — ASCORBIC ACID 500 MG PO TABS: 500.0000 mg | ORAL_TABLET | Freq: Two times a day (BID) | ORAL | 0 refills | Status: AC

## 2020-04-02 NOTE — Progress Notes (Signed)
Patient scheduled for outpatient Remdesivir infusions at 10am on Saturday 8/7 and Monday 8/9 at [facility]. Please inform the patient to park at [place], as staff will be escorting the patient through the [place] entrance of the hospital.    There is a wave flag banner located near the entrance on [address]. Turn into this entrance and immediately turn left and park in 1 of the 5 designated Covid Infusion Parking spots. There is a phone number on the sign, please call and let the staff know what spot you are in and we will come out and get you. For questions call [phone].  Thanks.

## 2020-04-02 NOTE — Discharge Instructions (Signed)
Patient scheduled for outpatient Remdesivir infusions at 10am on Saturday 8/7 and Monday 8/9 at [facility]. Please inform the patient to park at [place], as staff will be escorting the patient through the [place] entrance of the hospital.    There is a wave flag banner located near the entrance on [address]. Turn into this entrance and immediately turn left and park in 1 of the 5 designated Covid Infusion Parking spots. There is a phone number on the sign, please stay in your car and call and let the staff know what spot you are in and we will come out and get you. For questions call [phone].  Thanks.    COVID-19 COVID-19 is a respiratory infection that is caused by a virus called severe acute respiratory syndrome coronavirus 2 (SARS-CoV-2). The disease is also known as coronavirus disease or novel coronavirus. In some people, the virus may not cause any symptoms. In others, it may cause a serious infection. The infection can get worse quickly and can lead to complications, such as:  Pneumonia, or infection of the lungs.  Acute respiratory distress syndrome or ARDS. This is a condition in which fluid build-up in the lungs prevents the lungs from filling with air and passing oxygen into the blood.  Acute respiratory failure. This is a condition in which there is not enough oxygen passing from the lungs to the body or when carbon dioxide is not passing from the lungs out of the body.  Sepsis or septic shock. This is a serious bodily reaction to an infection.  Blood clotting problems.  Secondary infections due to bacteria or fungus.  Organ failure. This is when your body's organs stop working. The virus that causes COVID-19 is contagious. This means that it can spread from person to person through droplets from coughs and sneezes (respiratory secretions). What are the causes? This illness is caused by a virus. You may catch the virus by:  Breathing in droplets  from an infected person. Droplets can be spread by a person breathing, speaking, singing, coughing, or sneezing.  Touching something, like a table or a doorknob, that was exposed to the virus (contaminated) and then touching your mouth, nose, or eyes. What increases the risk? Risk for infection You are more likely to be infected with this virus if you:  Are within 6 feet (2 meters) of a person with COVID-19.  Provide care for or live with a person who is infected with COVID-19.  Spend time in crowded indoor spaces or live in shared housing. Risk for serious illness You are more likely to become seriously ill from the virus if you:  Are 41 years of age or older. The higher your age, the more you are at risk for serious illness.  Live in a nursing home or long-term care facility.  Have cancer.  Have a long-term (chronic) disease such as: ? Chronic lung disease, including chronic obstructive pulmonary disease or asthma. ? A long-term disease that lowers your body's ability to fight infection (immunocompromised). ? Heart disease, including heart failure, a condition in which the arteries that lead to the heart become narrow or blocked (coronary artery disease), a disease which makes the heart muscle thick, weak, or stiff (cardiomyopathy). ? Diabetes. ? Chronic kidney disease. ? Sickle cell disease, a condition in which red blood cells have an abnormal "sickle" shape. ? Liver disease.  Are obese. What are the signs or symptoms? Symptoms of this condition can range  from mild to severe. Symptoms may appear any time from 2 to 14 days after being exposed to the virus. They include:  A fever or chills.  A cough.  Difficulty breathing.  Headaches, body aches, or muscle aches.  Runny or stuffy (congested) nose.  A sore throat.  New loss of taste or smell. Some people may also have stomach problems, such as nausea, vomiting, or diarrhea. Other people may not have any symptoms of  COVID-19. How is this diagnosed? This condition may be diagnosed based on:  Your signs and symptoms, especially if: ? You live in an area with a COVID-19 outbreak. ? You recently traveled to or from an area where the virus is common. ? You provide care for or live with a person who was diagnosed with COVID-19. ? You were exposed to a person who was diagnosed with COVID-19.  A physical exam.  Lab tests, which may include: ? Taking a sample of fluid from the back of your nose and throat (nasopharyngeal fluid), your nose, or your throat using a swab. ? A sample of mucus from your lungs (sputum). ? Blood tests.  Imaging tests, which may include, X-rays, CT scan, or ultrasound. How is this treated? At present, there is no medicine to treat COVID-19. Medicines that treat other diseases are being used on a trial basis to see if they are effective against COVID-19. Your health care provider will talk with you about ways to treat your symptoms. For most people, the infection is mild and can be managed at home with rest, fluids, and over-the-counter medicines. Treatment for a serious infection usually takes places in a hospital intensive care unit (ICU). It may include one or more of the following treatments. These treatments are given until your symptoms improve.  Receiving fluids and medicines through an IV.  Supplemental oxygen. Extra oxygen is given through a tube in the nose, a face mask, or a hood.  Positioning you to lie on your stomach (prone position). This makes it easier for oxygen to get into the lungs.  Continuous positive airway pressure (CPAP) or bi-level positive airway pressure (BPAP) machine. This treatment uses mild air pressure to keep the airways open. A tube that is connected to a motor delivers oxygen to the body.  Ventilator. This treatment moves air into and out of the lungs by using a tube that is placed in your windpipe.  Tracheostomy. This is a procedure to create a  hole in the neck so that a breathing tube can be inserted.  Extracorporeal membrane oxygenation (ECMO). This procedure gives the lungs a chance to recover by taking over the functions of the heart and lungs. It supplies oxygen to the body and removes carbon dioxide. Follow these instructions at home: Lifestyle  If you are sick, stay home except to get medical care. Your health care provider will tell you how long to stay home. Call your health care provider before you go for medical care.  Rest at home as told by your health care provider.  Do not use any products that contain nicotine or tobacco, such as cigarettes, e-cigarettes, and chewing tobacco. If you need help quitting, ask your health care provider.  Return to your normal activities as told by your health care provider. Ask your health care provider what activities are safe for you. General instructions  Take over-the-counter and prescription medicines only as told by your health care provider.  Drink enough fluid to keep your urine pale yellow.  Keep  all follow-up visits as told by your health care provider. This is important. How is this prevented?  There is no vaccine to help prevent COVID-19 infection. However, there are steps you can take to protect yourself and others from this virus. To protect yourself:   Do not travel to areas where COVID-19 is a risk. The areas where COVID-19 is reported change often. To identify high-risk areas and travel restrictions, check the CDC travel website: FatFares.com.br  If you live in, or must travel to, an area where COVID-19 is a risk, take precautions to avoid infection. ? Stay away from people who are sick. ? Wash your hands often with soap and water for 20 seconds. If soap and water are not available, use an alcohol-based hand sanitizer. ? Avoid touching your mouth, face, eyes, or nose. ? Avoid going out in public, follow guidance from your state and local health  authorities. ? If you must go out in public, wear a cloth face covering or face mask. Make sure your mask covers your nose and mouth. ? Avoid crowded indoor spaces. Stay at least 6 feet (2 meters) away from others. ? Disinfect objects and surfaces that are frequently touched every day. This may include:  Counters and tables.  Doorknobs and light switches.  Sinks and faucets.  Electronics, such as phones, remote controls, keyboards, computers, and tablets. To protect others: If you have symptoms of COVID-19, take steps to prevent the virus from spreading to others.  If you think you have a COVID-19 infection, contact your health care provider right away. Tell your health care team that you think you may have a COVID-19 infection.  Stay home. Leave your house only to seek medical care. Do not use public transport.  Do not travel while you are sick.  Wash your hands often with soap and water for 20 seconds. If soap and water are not available, use alcohol-based hand sanitizer.  Stay away from other members of your household. Let healthy household members care for children and pets, if possible. If you have to care for children or pets, wash your hands often and wear a mask. If possible, stay in your own room, separate from others. Use a different bathroom.  Make sure that all people in your household wash their hands well and often.  Cough or sneeze into a tissue or your sleeve or elbow. Do not cough or sneeze into your hand or into the air.  Wear a cloth face covering or face mask. Make sure your mask covers your nose and mouth. Where to find more information  Centers for Disease Control and Prevention: PurpleGadgets.be  World Health Organization: https://www.castaneda.info/ Contact a health care provider if:  You live in or have traveled to an area where COVID-19 is a risk and you have symptoms of the infection.  You have had contact with  someone who has COVID-19 and you have symptoms of the infection. Get help right away if:  You have trouble breathing.  You have pain or pressure in your chest.  You have confusion.  You have bluish lips and fingernails.  You have difficulty waking from sleep.  You have symptoms that get worse. These symptoms may represent a serious problem that is an emergency. Do not wait to see if the symptoms will go away. Get medical help right away. Call your local emergency services (911 in the [place] Do not drive yourself to the hospital. Let the emergency medical personnel know if you think you have  COVID-19. Summary  COVID-19 is a respiratory infection that is caused by a virus. It is also known as coronavirus disease or novel coronavirus. It can cause serious infections, such as pneumonia, acute respiratory distress syndrome, acute respiratory failure, or sepsis.  The virus that causes COVID-19 is contagious. This means that it can spread from person to person through droplets from breathing, speaking, singing, coughing, or sneezing.  You are more likely to develop a serious illness if you are 79 years of age or older, have a weak immune system, live in a nursing home, or have chronic disease.  There is no medicine to treat COVID-19. Your health care provider will talk with you about ways to treat your symptoms.  Take steps to protect yourself and others from infection. Wash your hands often and disinfect objects and surfaces that are frequently touched every day. Stay away from people who are sick and wear a mask if you are sick. This information is not intended to replace advice given to you by your health care provider. Make sure you discuss any questions you have with your health care provider. Document Revised: 06/13/2019 Document Reviewed: 09/19/2018 Elsevier Patient Education  2020 [employer].   COVID-19 Frequently Asked Questions COVID-19 (coronavirus disease) is an infection that  is caused by a large family of viruses. Some viruses cause illness in people and others cause illness in animals like camels, cats, and bats. In some cases, the viruses that cause illness in animals can spread to humans. Where did the coronavirus come from? In December 2019, [place] told the [facility]) of several cases of lung disease (human respiratory illness). These cases were linked to an open seafood and livestock market in the city of [place]. The link to the seafood and livestock market suggests that the virus may have spread from animals to humans. However, since that first outbreak in December, the virus has also been shown to spread from person to person. What is the name of the disease and the virus? Disease name Early on, this disease was called novel coronavirus. This is because scientists determined that the disease was caused by a new (novel) respiratory virus. The World Health Organization [facility]) has now named the disease COVID-19, or coronavirus disease. Virus name The virus that causes the disease is called severe acute respiratory syndrome coronavirus 2 (SARS-CoV-2). More information on disease and virus naming World Health Organization [facility]): www.who.int/emergencies/diseases/novel-coronavirus-2019/technical-guidance/naming-the-coronavirus-disease-(covid-2019)-and-the-virus-that-causes-it Who is at risk for complications from coronavirus disease? Some people may be at higher risk for complications from coronavirus disease. This includes older adults and people who have chronic diseases, such as heart disease, diabetes, and lung disease. If you are at higher risk for complications, take these extra precautions:  Stay home as much as possible.  Avoid social gatherings and travel.  Avoid close contact with others. Stay at least 6 ft (2 m) away from others, if possible.  Wash your hands often with soap and water for at least 20 seconds.  Avoid touching your face,  mouth, nose, or eyes.  Keep supplies on hand at home, such as food, medicine, and cleaning supplies.  If you must go out in public, wear a cloth face covering or face mask. Make sure your mask covers your nose and mouth. How does coronavirus disease spread? The virus that causes coronavirus disease spreads easily from person to person (is contagious). You may catch the virus by:  Breathing in droplets from an infected person. Droplets can be spread by a  person breathing, speaking, singing, coughing, or sneezing.  Touching something, like a table or a doorknob, that was exposed to the virus (contaminated) and then touching your mouth, nose, or eyes. Can I get the virus from touching surfaces or objects? There is still a lot that we do not know about the virus that causes coronavirus disease. Scientists are basing a lot of information on what they know about similar viruses, such as:  Viruses cannot generally survive on surfaces for long. They need a human body (host) to survive.  It is more likely that the virus is spread by close contact with people who are sick (direct contact), such as through: ? Shaking hands or hugging. ? Breathing in respiratory droplets that travel through the air. Droplets can be spread by a person breathing, speaking, singing, coughing, or sneezing.  It is less likely that the virus is spread when a person touches a surface or object that has the virus on it (indirect contact). The virus may be able to enter the body if the person touches a surface or object and then touches his or her face, eyes, nose, or mouth. Can a person spread the virus without having symptoms of the disease? It may be possible for the virus to spread before a person has symptoms of the disease, but this is most likely not the main way the virus is spreading. It is more likely for the virus to spread by being in close contact with people who are sick and breathing in the respiratory droplets  spread by a person breathing, speaking, singing, coughing, or sneezing. What are the symptoms of coronavirus disease? Symptoms vary from person to person and can range from mild to severe. Symptoms may include:  Fever or chills.  Cough.  Difficulty breathing or feeling short of breath.  Headaches, body aches, or muscle aches.  Runny or stuffy (congested) nose.  Sore throat.  New loss of taste or smell.  Nausea, vomiting, or diarrhea. These symptoms can appear anywhere from 2 to 14 days after you have been exposed to the virus. Some people may not have any symptoms. If you develop symptoms, call your health care provider. People with severe symptoms may need hospital care. Should I be tested for this virus? Your health care provider will decide whether to test you based on your symptoms, history of exposure, and your risk factors. How does a health care provider test for this virus? Health care providers will collect samples to send for testing. Samples may include:  Taking a swab of fluid from the back of your nose and throat, your nose, or your throat.  Taking fluid from the lungs by having you cough up mucus (sputum) into a sterile cup.  Taking a blood sample. Is there a treatment or vaccine for this virus? Currently, there is no vaccine to prevent coronavirus disease. Also, there are no medicines like antibiotics or antivirals to treat the virus. A person who becomes sick is given supportive care, which means rest and fluids. A person may also relieve his or her symptoms by using over-the-counter medicines that treat sneezing, coughing, and runny nose. These are the same medicines that a person takes for the common cold. If you develop symptoms, call your health care provider. People with severe symptoms may need hospital care. What can I do to protect myself and my family from this virus?     You can protect yourself and your family by taking the same actions that you would  take to prevent the spread of other viruses. Take the following actions:  Wash your hands often with soap and water for at least 20 seconds. If soap and water are not available, use alcohol-based hand sanitizer.  Avoid touching your face, mouth, nose, or eyes.  Cough or sneeze into a tissue, sleeve, or elbow. Do not cough or sneeze into your hand or the air. ? If you cough or sneeze into a tissue, throw it away immediately and wash your hands.  Disinfect objects and surfaces that you frequently touch every day.  Stay away from people who are sick.  Avoid going out in public, follow guidance from your state and local health authorities.  Avoid crowded indoor spaces. Stay at least 6 ft (2 m) away from others.  If you must go out in public, wear a cloth face covering or face mask. Make sure your mask covers your nose and mouth.  Stay home if you are sick, except to get medical care. Call your health care provider before you get medical care. Your health care provider will tell you how long to stay home.  Make sure your vaccines are up to date. Ask your health care provider what vaccines you need. What should I do if I need to travel? Follow travel recommendations from your local health authority, the CDC, and WHO. Travel information and advice  Centers for Disease Control and Prevention (CDC): BodyEditor.hu  World Health Organization [facility]): ThirdIncome.ca Know the risks and take action to protect your health  You are at higher risk of getting coronavirus disease if you are traveling to areas with an outbreak or if you are exposed to travelers from areas with an outbreak.  Wash your hands often and practice good hygiene to lower the risk of catching or spreading the virus. What should I do if I am sick? General instructions to stop the spread of infection  Wash your hands often with soap  and water for at least 20 seconds. If soap and water are not available, use alcohol-based hand sanitizer.  Cough or sneeze into a tissue, sleeve, or elbow. Do not cough or sneeze into your hand or the air.  If you cough or sneeze into a tissue, throw it away immediately and wash your hands.  Stay home unless you must get medical care. Call your health care provider or local health authority before you get medical care.  Avoid public areas. Do not take public transportation, if possible.  If you can, wear a mask if you must go out of the house or if you are in close contact with someone who is not sick. Make sure your mask covers your nose and mouth. Keep your home clean  Disinfect objects and surfaces that are frequently touched every day. This may include: ? Counters and tables. ? Doorknobs and light switches. ? Sinks and faucets. ? Electronics such as phones, remote controls, keyboards, computers, and tablets.  Wash dishes in hot, soapy water or use a dishwasher. Air-dry your dishes.  Wash laundry in hot water. Prevent infecting other household members  Let healthy household members care for children and pets, if possible. If you have to care for children or pets, wash your hands often and wear a mask.  Sleep in a different bedroom or bed, if possible.  Do not share personal items, such as razors, toothbrushes, deodorant, combs, brushes, towels, and washcloths. Where to find more information Centers for Disease Control and Prevention (CDC)  Information and news updates:  https://www.butler-gonzalez.com/ World Health Organization [facility])  Information and news updates: MissExecutive.com.ee  Coronavirus health topic: https://www.castaneda.info/  Questions and answers on COVID-19: OpportunityDebt.at  Global tracker: who.sprinklr.com American Academy of Pediatrics (AAP)  Information for families:  www.healthychildren.org/English/health-issues/conditions/chest-lungs/Pages/2019-Novel-Coronavirus.aspx The coronavirus situation is changing rapidly. Check your local health authority website or the CDC and [facility] websites for updates and news. When should I contact a health care provider?  Contact your health care provider if you have symptoms of an infection, such as fever or cough, and you: ? Have been near anyone who is known to have coronavirus disease. ? Have come into contact with a person who is suspected to have coronavirus disease. ? Have traveled to an area where there is an outbreak of COVID-19. When should I get emergency medical care?  Get help right away by calling your local emergency services (911 in the [place] if you have: ? Trouble breathing. ? Pain or pressure in your chest. ? Confusion. ? Blue-tinged lips and fingernails. ? Difficulty waking from sleep. ? Symptoms that get worse. Let the emergency medical personnel know if you think you have coronavirus disease. Summary  A new respiratory virus is spreading from person to person and causing COVID-19 (coronavirus disease).  The virus that causes COVID-19 appears to spread easily. It spreads from one person to another through droplets from breathing, speaking, singing, coughing, or sneezing.  Older adults and those with chronic diseases are at higher risk of disease. If you are at higher risk for complications, take extra precautions.  There is currently no vaccine to prevent coronavirus disease. There are no medicines, such as antibiotics or antivirals, to treat the virus.  You can protect yourself and your family by washing your hands often, avoiding touching your face, and covering your coughs and sneezes. This information is not intended to replace advice given to you by your health care provider. Make sure you discuss any questions you have with your health care provider. Document Revised: 06/13/2019 Document  Reviewed: 12/10/2018 Elsevier Patient Education  [place]: Quarantine vs. Isolation QUARANTINE keeps someone who was in close contact with someone who has COVID-19 away from others. If you had close contact with a person who has COVID-19  Stay home until 14 days after your last contact.  Check your temperature twice a day and watch for symptoms of COVID-19.  If possible, stay away from people who are at higher-risk for getting very sick from COVID-19. ISOLATION keeps someone who is sick or tested positive for COVID-19 without symptoms away from others, even in their own home. If you are sick and think or know you have COVID-19  Stay home until after ? At least 10 days since symptoms first appeared and ? At least 24 hours with no fever without fever-reducing medication and ? Symptoms have improved If you tested positive for COVID-19 but do not have symptoms  Stay home until after ? 10 days have passed since your positive test If you live with others, stay in a specific "sick room" or area and away from other people or animals, including pets. Use a separate bathroom, if available. [email or web address] 03/17/2019 This information is not intended to replace advice given to you by your health care provider. Make sure you discuss any questions you have with your health care provider. Document Revised: 07/31/2019 Document Reviewed: 07/31/2019 Elsevier Patient Education  [place]: How to Protect Yourself and Others Know how it spreads  There is currently no  vaccine to prevent coronavirus disease 2019 (COVID-19).  The best way to prevent illness is to avoid being exposed to this virus.  The virus is thought to spread mainly from person-to-person. ? Between people who are in close contact with one another (within about 6 feet). ? Through respiratory droplets produced when an infected person coughs, sneezes or talks. ? These droplets can land  in the mouths or noses of people who are nearby or possibly be inhaled into the lungs. ? COVID-19 may be spread by people who are not showing symptoms. Everyone should Clean your hands often  Wash your hands often with soap and water for at least 20 seconds especially after you have been in a public place, or after blowing your nose, coughing, or sneezing.  If soap and water are not readily available, use a hand sanitizer that contains at least 60% alcohol. Cover all surfaces of your hands and rub them together until they feel dry.  Avoid touching your eyes, nose, and mouth with unwashed hands. Avoid close contact  Limit contact with others as much as possible.  Avoid close contact with people who are sick.  Put distance between yourself and other people. ? Remember that some people without symptoms may be able to spread virus. ? This is especially important for people who are at higher risk of getting very [email or web address] Cover your mouth and nose with a mask when around others  You could spread COVID-19 to others even if you do not feel sick.  Everyone should wear a mask in public settings and when around people not living in their household, especially when social distancing is difficult to maintain. ? Masks should not be placed on young children under age 21, anyone who has trouble breathing, or is unconscious, incapacitated or otherwise unable to remove the mask without assistance.  The mask is meant to protect other people in case you are infected.  Do NOT use a facemask meant for a Dietitian.  Continue to keep about 6 feet between yourself and others. The mask is not a substitute for social distancing. Cover coughs and sneezes  Always cover your mouth and nose with a tissue when you cough or sneeze or use the inside of your elbow.  Throw used tissues in the trash.  Immediately wash your hands  with soap and water for at least 20 seconds. If soap and water are not readily available, clean your hands with a hand sanitizer that contains at least 60% alcohol. Clean and disinfect  Clean AND disinfect frequently touched surfaces daily. This includes tables, doorknobs, light switches, countertops, handles, desks, phones, keyboards, toilets, faucets, and sinks. [email or web address]  If surfaces are dirty, clean them: Use detergent or soap and water prior to disinfection.  Then, use a household disinfectant. You can see a list of EPA-registered household disinfectants here. [email or web address] 04/30/2019 This information is not intended to replace advice given to you by your health care provider. Make sure you discuss any questions you have with your health care provider. Document Revised: 05/08/2019 Document Reviewed: 03/06/2019 Elsevier Patient Education  [place] Can Do to Manage Your COVID-19 Symptoms at Home If you have possible or confirmed COVID-19: 1. Stay home from work and school. And stay away from other public places. If you must go out, avoid using any kind of public transportation, ridesharing, or taxis. 2. Monitor your symptoms carefully. If your symptoms get worse, call your healthcare provider immediately. 3. Get rest and  stay hydrated. 4. If you have a medical appointment, call the healthcare provider ahead of time and tell them that you have or may have COVID-19. 5. For medical emergencies, call 911 and notify the dispatch personnel that you have or may have COVID-19. 6. Cover your cough and sneezes with a tissue or use the inside of your elbow. 7. Wash your hands often with soap and water for at least 20 seconds or clean your hands with an alcohol-based hand sanitizer that contains at least 60% alcohol. 8. As much as possible, stay in a specific room and away from other people in your  home. Also, you should use a separate bathroom, if available. If you need to be around other people in or outside of the home, wear a mask. 9. Avoid sharing personal items with other people in your household, like dishes, towels, and bedding. 10. Clean all surfaces that are touched often, like counters, tabletops, and doorknobs. Use household cleaning sprays or wipes according to the label instructions. [email or web address] 02/26/2019 This information is not intended to replace advice given to you by your health care provider. Make sure you discuss any questions you have with your health care provider. Document Revised: 07/31/2019 Document Reviewed: 07/31/2019 Elsevier Patient Education  [place].

## 2020-04-03 ENCOUNTER — Encounter (HOSPITAL_COMMUNITY): Payer: Self-pay

## 2020-04-03 ENCOUNTER — Ambulatory Visit (HOSPITAL_COMMUNITY)
Admit: 2020-04-03 | Discharge: 2020-04-03 | Disposition: A | Discharge status: Home / Self Care | Payer: Managed Care, Other (non HMO) | Attending: Pulmonary Disease | Admitting: Pulmonary Disease

## 2020-04-03 DIAGNOSIS — J1282 Pneumonia due to coronavirus disease 2019: Secondary | ICD-10-CM | POA: Diagnosis not present

## 2020-04-03 DIAGNOSIS — U071 COVID-19: Secondary | ICD-10-CM | POA: Diagnosis not present

## 2020-04-03 MED ORDER — ALBUTEROL SULFATE HFA 108 (90 BASE) MCG/ACT IN AERS: 2.0000 | INHALATION_SPRAY | Freq: Once | RESPIRATORY_TRACT | Status: DC | PRN

## 2020-04-03 MED ORDER — SODIUM CHLORIDE 0.9 % IV SOLN: INTRAVENOUS | Status: DC | PRN

## 2020-04-03 MED ORDER — SODIUM CHLORIDE 0.9 % IV SOLN: 100.0000 mg | Freq: Once | INTRAVENOUS | Status: DC

## 2020-04-03 MED ORDER — SODIUM CHLORIDE 0.9 % IV SOLN
100.0000 mg | Freq: Once | INTRAVENOUS | Status: AC
  Administered 2020-04-03: 100 mg via INTRAVENOUS
  Filled 2020-04-03: qty 20

## 2020-04-03 MED ORDER — DIPHENHYDRAMINE HCL 50 MG/ML IJ SOLN: 50.0000 mg | Freq: Once | INTRAMUSCULAR | Status: DC | PRN

## 2020-04-03 MED ORDER — FAMOTIDINE IN NACL 20-0.9 MG/50ML-% IV SOLN: 20.0000 mg | Freq: Once | INTRAVENOUS | Status: DC | PRN

## 2020-04-03 MED ORDER — METHYLPREDNISOLONE SODIUM SUCC 125 MG IJ SOLR: 125.0000 mg | Freq: Once | INTRAMUSCULAR | Status: DC | PRN

## 2020-04-03 MED ORDER — EPINEPHRINE 0.3 MG/0.3ML IJ SOAJ: 0.3000 mg | Freq: Once | INTRAMUSCULAR | Status: DC | PRN

## 2020-04-03 NOTE — Discharge Instructions (Signed)
10 Things You Can Do to Manage Your COVID-19 Symptoms at Home If you have possible or confirmed COVID-19: 1. Stay home from work and school. And stay away from other public places. If you must go out, avoid using any kind of public transportation, ridesharing, or taxis. 2. Monitor your symptoms carefully. If your symptoms get worse, call your healthcare provider immediately. 3. Get rest and stay hydrated. 4. If you have a medical appointment, call the healthcare provider ahead of time and tell them that you have or may have COVID-19. 5. For medical emergencies, call 911 and notify the dispatch personnel that you have or may have COVID-19. 6. Cover your cough and sneezes with a tissue or use the inside of your elbow. 7. Wash your hands often with soap and water for at least 20 seconds or clean your hands with an alcohol-based hand sanitizer that contains at least 60% alcohol. 8. As much as possible, stay in a specific room and away from other people in your home. Also, you should use a separate bathroom, if available. If you need to be around other people in or outside of the home, wear a mask. 9. Avoid sharing personal items with other people in your household, like dishes, towels, and bedding. 10. Clean all surfaces that are touched often, like counters, tabletops, and doorknobs. Use household cleaning sprays or wipes according to the label instructions. [email or web address] 02/26/2019

## 2020-04-03 NOTE — Progress Notes (Signed)
   Diagnosis: COVID-19  Physician: Dr. Asencion Noble  Procedure: [facility] Med: remdesivir infusion - Provided patient with remdesivir fact sheet for patients, parents and caregivers prior to infusion.  Complications: No immediate complications noted.  Discharge: Discharged home   Janne Napoleon 04/03/2020

## 2020-04-03 NOTE — Discharge Summary (Signed)
[facility] NAME: Natalie Strickland    MR#:  [id number]  DATE OF BIRTH:  06-Jul-1971  DATE OF ADMISSION:  03/31/2020   ADMITTING PHYSICIAN: Loletha Grayer, MD  DATE OF DISCHARGE: 04/02/2020  1:00 PM  PRIMARY CARE PHYSICIAN: Idelle Crouch, MD   ADMISSION DIAGNOSIS:  Hypokalemia [E87.6] COVID-19 [U07.1] DISCHARGE DIAGNOSIS:  Active Problems:   Hypokalemia   COVID-19  SECONDARY DIAGNOSIS:   Past Medical History:  Diagnosis Date  . Anemia    low iron and vitamin b levels  . Anxiety   . Depression   . Dyspnea   . GERD (gastroesophageal reflux disease)   . Headache    dt anemia  . History of hiatal hernia   . History of kidney stones   . Hypercholesteremia   . Hypertension   . IBS (irritable bowel syndrome)   . Lymphocytic colitis    HOSPITAL COURSE:  49 year old female admitted for pneumonia due to Covid infection  Pneumonia due to Covid infection Patient is on room air.  Treated with remdesivir day 3/5 and steroid Patient would like to finish remaining two doses as an outpatient at [facility] clinic for [place] have been made by the clinic and patient is aware  Hypokalemia Repleted and resolved  Anxiety and depression: Continue BuSpar and Zoloft   DISCHARGE CONDITIONS:  Stable CONSULTS OBTAINED:   DRUG ALLERGIES:   Allergies  Allergen Reactions  . Anti-Inflammatory Enzyme [Nutritional Supplements] Other (See Comments)    History of lymphocytic colitis. Messes up her GI system  . Codeine Rash  . Penicillin G Hives    Has patient had a PCN reaction causing immediate rash, facial/tongue/throat swelling, SOB or lightheadedness with hypotension: No Has patient had a PCN reaction causing severe rash involving mucus membranes or skin necrosis: No Has patient had a PCN reaction that required hospitalization: No Has patient had a PCN reaction occurring within the last 10 years: No If all of the above  answers are "NO", then may proceed with Cephalosporin use.    DISCHARGE MEDICATIONS:   Allergies as of 04/02/2020      Reactions   Anti-inflammatory Enzyme [nutritional Supplements] Other (See Comments)   History of lymphocytic colitis. Messes up her GI system   Codeine Rash   Penicillin G Hives   Has patient had a PCN reaction causing immediate rash, facial/tongue/throat swelling, SOB or lightheadedness with hypotension: No Has patient had a PCN reaction causing severe rash involving mucus membranes or skin necrosis: No Has patient had a PCN reaction that required hospitalization: No Has patient had a PCN reaction occurring within the last 10 years: No If all of the above answers are "NO", then may proceed with Cephalosporin use.      Medication List    TAKE these medications   ascorbic acid 500 MG tablet Commonly known as: VITAMIN C Take 1 tablet (500 mg total) by mouth 2 (two) times daily.   Biotin 1000 MCG Chew Chew 1 tablet by mouth daily.   busPIRone 15 MG tablet Commonly known as: BUSPAR Take 30 mg by mouth 2 (two) times daily.   ferrous sulfate 325 (65 FE) MG tablet Take 325 mg by mouth daily.   furosemide 20 MG tablet Commonly known as: LASIX Take 20-40 mg by mouth See admin instructions. Take 1 tablet (20 mg) by mouth every morning & take 1 tablet (20 mg) by mouth every other day in  the evening.   hydrochlorothiazide 25 MG tablet Commonly known as: HYDRODIURIL Take 25 mg by mouth 2 (two) times daily.   levocetirizine 5 MG tablet Commonly known as: XYZAL Take 5 mg by mouth at bedtime.   lisinopril 20 MG tablet Commonly known as: ZESTRIL Take 20 mg by mouth every evening.   omeprazole 40 MG capsule Commonly known as: PRILOSEC Take 40 mg by mouth in the morning and at bedtime.   ondansetron 4 MG disintegrating tablet Commonly known as: Zofran ODT Take 1 tablet (4 mg total) by mouth every 6 (six) hours as needed for nausea.   Potassium 99 MG Tabs Take  99 mg by mouth daily.   sertraline 50 MG tablet Commonly known as: ZOLOFT Take 50 mg by mouth every evening.   simvastatin 20 MG tablet Commonly known as: ZOCOR Take 20 mg by mouth every evening.   vitamin B-12 1000 MCG tablet Commonly known as: CYANOCOBALAMIN Take 1,000 mcg by mouth daily.   zinc sulfate 220 (50 Zn) MG capsule Take 1 capsule (220 mg total) by mouth daily.      DISCHARGE INSTRUCTIONS:   DIET:  Regular diet DISCHARGE CONDITION:  Stable ACTIVITY:  Activity as tolerated OXYGEN:  Home Oxygen: No.  Oxygen Delivery: room air DISCHARGE LOCATION:  home   If you experience worsening of your admission symptoms, develop shortness of breath, life threatening emergency, suicidal or homicidal thoughts you must seek medical attention immediately by calling 911 or calling your MD immediately  if symptoms less severe.  You Must read complete instructions/literature along with all the possible adverse reactions/side effects for all the Medicines you take and that have been prescribed to you. Take any new Medicines after you have completely understood and accpet all the possible adverse reactions/side effects.   Please note  You were cared for by a hospitalist during your hospital stay. If you have any questions about your discharge medications or the care you received while you were in the hospital after you are discharged, you can call the unit and asked to speak with the hospitalist on call if the hospitalist that took care of you is not available. Once you are discharged, your primary care physician will handle any further medical issues. Please note that NO REFILLS for any discharge medications will be authorized once you are discharged, as it is imperative that you return to your primary care physician (or establish a relationship with a primary care physician if you do not have one) for your aftercare needs so that they can reassess your need for medications and monitor  your lab values.    On the day of Discharge:  VITAL SIGNS:  Blood pressure 119/74, pulse 68, temperature (!) 97.5 F (36.4 C), temperature source Oral, resp. rate 17, height 5\' 5"  (1.651 m), weight 90.7 kg, last menstrual period 11/04/2019, SpO2 98 %. PHYSICAL EXAMINATION:  GENERAL:  49 y.o.-year-old patient lying in the bed with no acute distress.  EYES: Pupils equal, round, reactive to light and accommodation. No scleral icterus. Extraocular muscles intact.  HEENT: Head atraumatic, normocephalic. Oropharynx and nasopharynx clear.  NECK:  Supple, no jugular venous distention. No thyroid enlargement, no tenderness.  LUNGS: Normal breath sounds bilaterally, no wheezing, rales,rhonchi or crepitation. No use of accessory muscles of respiration.  CARDIOVASCULAR: S1, S2 normal. No murmurs, rubs, or gallops.  ABDOMEN: Soft, non-tender, non-distended. Bowel sounds present. No organomegaly or mass.  EXTREMITIES: No pedal edema, cyanosis, or clubbing.  NEUROLOGIC: Cranial nerves II through XII  are intact. Muscle strength 5/5 in all extremities. Sensation intact. Gait not checked.  PSYCHIATRIC: The patient is alert and oriented x 3.  SKIN: No obvious rash, lesion, or ulcer.  DATA REVIEW:   CBC Recent Labs  Lab 04/02/20 0644  WBC 7.5  HGB 12.0  HCT 35.2*  PLT 295    Chemistries  Recent Labs  Lab 04/02/20 0644  NA 141  K 3.1*  CL 109  CO2 24  GLUCOSE 98  BUN 10  CREATININE 0.81  CALCIUM 7.7*  MG 2.1  AST 31  ALT 59*  ALKPHOS 52  BILITOT 0.5     Outpatient follow-up  Follow-up Information    Idelle Crouch, MD. Go on 04/06/2020.   Specialty: Internal Medicine Why: @ 2:30pm Contact information: [place] [address] [phone]                Management plans discussed with the patient, family and they are in agreement.  CODE STATUS: Prior   TOTAL TIME TAKING CARE OF THIS PATIENT: 45 minutes.    Max Sane M.D  on 04/03/2020 at 2:51 PM  [facility]   CC: Primary care physician; Idelle Crouch, MD   Note: This dictation was prepared with Dragon dictation along with smaller phrase technology. Any transcriptional errors that result from this process are unintentional.

## 2020-04-05 ENCOUNTER — Ambulatory Visit (HOSPITAL_COMMUNITY)
Admit: 2020-04-05 | Discharge: 2020-04-05 | Disposition: A | Discharge status: Home / Self Care | Payer: Managed Care, Other (non HMO) | Attending: Pulmonary Disease | Admitting: Pulmonary Disease

## 2020-04-05 DIAGNOSIS — U071 COVID-19: Secondary | ICD-10-CM | POA: Insufficient documentation

## 2020-04-05 MED ORDER — METHYLPREDNISOLONE SODIUM SUCC 125 MG IJ SOLR: 125.0000 mg | Freq: Once | INTRAMUSCULAR | Status: DC | PRN

## 2020-04-05 MED ORDER — SODIUM CHLORIDE 0.9 % IV SOLN
100.0000 mg | Freq: Once | INTRAVENOUS | Status: AC
  Administered 2020-04-05: 100 mg via INTRAVENOUS
  Filled 2020-04-05: qty 20

## 2020-04-05 MED ORDER — ALBUTEROL SULFATE HFA 108 (90 BASE) MCG/ACT IN AERS: 2.0000 | INHALATION_SPRAY | Freq: Once | RESPIRATORY_TRACT | Status: DC | PRN

## 2020-04-05 MED ORDER — FAMOTIDINE IN NACL 20-0.9 MG/50ML-% IV SOLN: 20.0000 mg | Freq: Once | INTRAVENOUS | Status: DC | PRN

## 2020-04-05 MED ORDER — SODIUM CHLORIDE 0.9 % IV SOLN: INTRAVENOUS | Status: DC | PRN

## 2020-04-05 MED ORDER — DIPHENHYDRAMINE HCL 50 MG/ML IJ SOLN: 50.0000 mg | Freq: Once | INTRAMUSCULAR | Status: DC | PRN

## 2020-04-05 MED ORDER — EPINEPHRINE 0.3 MG/0.3ML IJ SOAJ: 0.3000 mg | Freq: Once | INTRAMUSCULAR | Status: DC | PRN

## 2020-04-05 NOTE — Discharge Instructions (Signed)

## 2020-04-05 NOTE — Progress Notes (Signed)
  Diagnosis: COVID-19  Physician:dr patrick wright   Procedure: Covid Infusion Clinic Med: remdesivir infusion - Provided patient with remdesivir fact sheet for patients, parents and caregivers prior to infusion.  Complications: No immediate complications noted.  Discharge: Discharged home   Heide Scales 04/05/2020

## 2020-10-04 ENCOUNTER — Other Ambulatory Visit: Payer: Self-pay | Admitting: Internal Medicine

## 2020-10-04 DIAGNOSIS — Z1231 Encounter for screening mammogram for malignant neoplasm of breast: Secondary | ICD-10-CM

## 2020-10-07 ENCOUNTER — Other Ambulatory Visit: Payer: Self-pay

## 2020-10-07 ENCOUNTER — Ambulatory Visit
Admission: RE | Admit: 2020-10-07 | Discharge: 2020-10-07 | Disposition: A | Discharge status: Home / Self Care | Payer: Managed Care, Other (non HMO) | Source: Ambulatory Visit | Attending: Internal Medicine | Admitting: Internal Medicine

## 2020-10-07 DIAGNOSIS — Z1231 Encounter for screening mammogram for malignant neoplasm of breast: Secondary | ICD-10-CM | POA: Diagnosis present

## 2021-09-15 ENCOUNTER — Other Ambulatory Visit: Payer: Self-pay | Admitting: Internal Medicine

## 2021-09-15 DIAGNOSIS — Z1231 Encounter for screening mammogram for malignant neoplasm of breast: Secondary | ICD-10-CM

## 2021-10-20 ENCOUNTER — Ambulatory Visit
Admission: RE | Admit: 2021-10-20 | Discharge: 2021-10-20 | Disposition: A | Discharge status: Home / Self Care | Payer: Managed Care, Other (non HMO) | Source: Ambulatory Visit | Attending: Internal Medicine | Admitting: Internal Medicine

## 2021-10-20 ENCOUNTER — Other Ambulatory Visit: Payer: Self-pay

## 2021-10-20 DIAGNOSIS — Z1231 Encounter for screening mammogram for malignant neoplasm of breast: Secondary | ICD-10-CM | POA: Insufficient documentation

## 2022-07-06 ENCOUNTER — Other Ambulatory Visit: Payer: Self-pay

## 2022-07-06 DIAGNOSIS — Z1231 Encounter for screening mammogram for malignant neoplasm of breast: Secondary | ICD-10-CM

## 2022-10-23 ENCOUNTER — Ambulatory Visit
Admission: RE | Admit: 2022-10-23 | Discharge: 2022-10-23 | Disposition: A | Discharge status: Home / Self Care | Payer: Managed Care, Other (non HMO) | Source: Ambulatory Visit | Attending: Internal Medicine | Admitting: Internal Medicine

## 2022-10-23 DIAGNOSIS — Z1231 Encounter for screening mammogram for malignant neoplasm of breast: Secondary | ICD-10-CM | POA: Diagnosis present

## 2023-02-06 ENCOUNTER — Other Ambulatory Visit: Payer: Self-pay | Admitting: Otolaryngology

## 2023-02-06 DIAGNOSIS — E041 Nontoxic single thyroid nodule: Secondary | ICD-10-CM

## 2023-02-14 ENCOUNTER — Ambulatory Visit
Admission: RE | Admit: 2023-02-14 | Discharge: 2023-02-14 | Disposition: A | Discharge status: Home / Self Care | Payer: Managed Care, Other (non HMO) | Source: Ambulatory Visit | Attending: Otolaryngology | Admitting: Otolaryngology

## 2023-02-14 DIAGNOSIS — E041 Nontoxic single thyroid nodule: Secondary | ICD-10-CM

## 2023-09-03 ENCOUNTER — Telehealth: Payer: Managed Care, Other (non HMO) | Admitting: Family Medicine

## 2023-09-03 DIAGNOSIS — J40 Bronchitis, not specified as acute or chronic: Secondary | ICD-10-CM | POA: Diagnosis not present

## 2023-09-03 DIAGNOSIS — J069 Acute upper respiratory infection, unspecified: Secondary | ICD-10-CM

## 2023-09-03 MED ORDER — AZITHROMYCIN 250 MG PO TABS: ORAL_TABLET | ORAL | 0 refills | Status: AC

## 2023-09-03 MED ORDER — PROMETHAZINE-DM 6.25-15 MG/5ML PO SYRP
5.0000 mL | ORAL_SOLUTION | Freq: Four times a day (QID) | ORAL | 0 refills | Status: AC | PRN
Start: 2023-09-03 — End: ?

## 2023-09-03 MED ORDER — FLUTICASONE PROPIONATE 50 MCG/ACT NA SUSP: 2.0000 | Freq: Every day | NASAL | 0 refills | Status: AC

## 2023-09-03 NOTE — Addendum Note (Signed)
 Addended by: Viviano Simas E on: 09/03/2023 04:04 PM   Modules accepted: Orders

## 2023-09-03 NOTE — Progress Notes (Signed)

## 2023-09-06 MED ORDER — ALBUTEROL SULFATE HFA 108 (90 BASE) MCG/ACT IN AERS: 2.0000 | INHALATION_SPRAY | Freq: Four times a day (QID) | RESPIRATORY_TRACT | 0 refills | Status: AC | PRN

## 2023-09-06 NOTE — Addendum Note (Signed)
 Addended by: Harlow Mares on: 09/06/2023 08:31 AM   Modules accepted: Orders

## 2023-09-10 ENCOUNTER — Other Ambulatory Visit: Payer: Self-pay | Admitting: Internal Medicine

## 2023-09-10 DIAGNOSIS — Z1231 Encounter for screening mammogram for malignant neoplasm of breast: Secondary | ICD-10-CM

## 2023-09-24 ENCOUNTER — Encounter: Payer: Self-pay | Admitting: Internal Medicine

## 2023-09-25 ENCOUNTER — Other Ambulatory Visit: Payer: Self-pay | Admitting: Internal Medicine

## 2023-09-25 DIAGNOSIS — R1010 Upper abdominal pain, unspecified: Secondary | ICD-10-CM

## 2023-09-27 ENCOUNTER — Ambulatory Visit
Admission: RE | Admit: 2023-09-27 | Discharge: 2023-09-27 | Disposition: A | Discharge status: Home / Self Care | Payer: Managed Care, Other (non HMO) | Source: Ambulatory Visit | Attending: Internal Medicine | Admitting: Internal Medicine

## 2023-09-27 DIAGNOSIS — N261 Atrophy of kidney (terminal): Secondary | ICD-10-CM | POA: Insufficient documentation

## 2023-09-27 DIAGNOSIS — K7689 Other specified diseases of liver: Secondary | ICD-10-CM | POA: Diagnosis not present

## 2023-09-27 DIAGNOSIS — R1010 Upper abdominal pain, unspecified: Secondary | ICD-10-CM | POA: Insufficient documentation

## 2023-10-25 ENCOUNTER — Ambulatory Visit
Admission: RE | Admit: 2023-10-25 | Discharge: 2023-10-25 | Disposition: A | Discharge status: Home / Self Care | Payer: Managed Care, Other (non HMO) | Source: Ambulatory Visit | Attending: Internal Medicine | Admitting: Internal Medicine

## 2023-10-25 DIAGNOSIS — Z1231 Encounter for screening mammogram for malignant neoplasm of breast: Secondary | ICD-10-CM | POA: Insufficient documentation

## 2023-11-18 IMAGING — MG MM DIGITAL SCREENING BILAT W/ TOMO AND CAD
8 series · 8 of 24 positions shown · non-contrast
Comparison: Previous exam(s).

CLINICAL DATA: Screening.

EXAM:
DIGITAL SCREENING BILATERAL MAMMOGRAM WITH TOMOSYNTHESIS AND CAD
TECHNIQUE: Bilateral screening digital craniocaudal and mediolateral oblique
mammograms were obtained. Bilateral screening digital breast
tomosynthesis was performed. The images were evaluated with
computer-aided detection.

[L CC synth-2D]
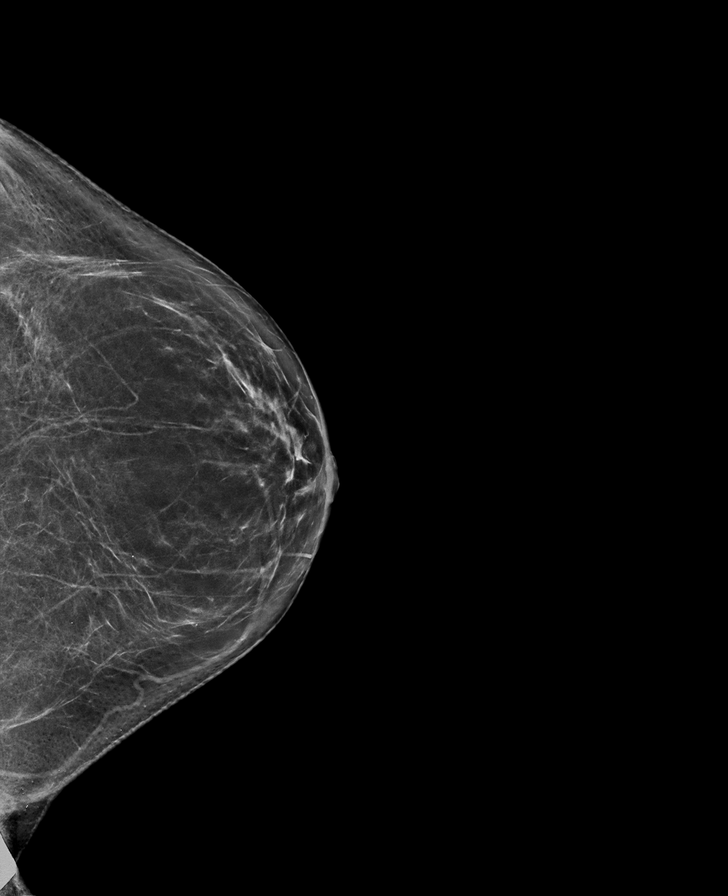

[L MLO synth-2D]
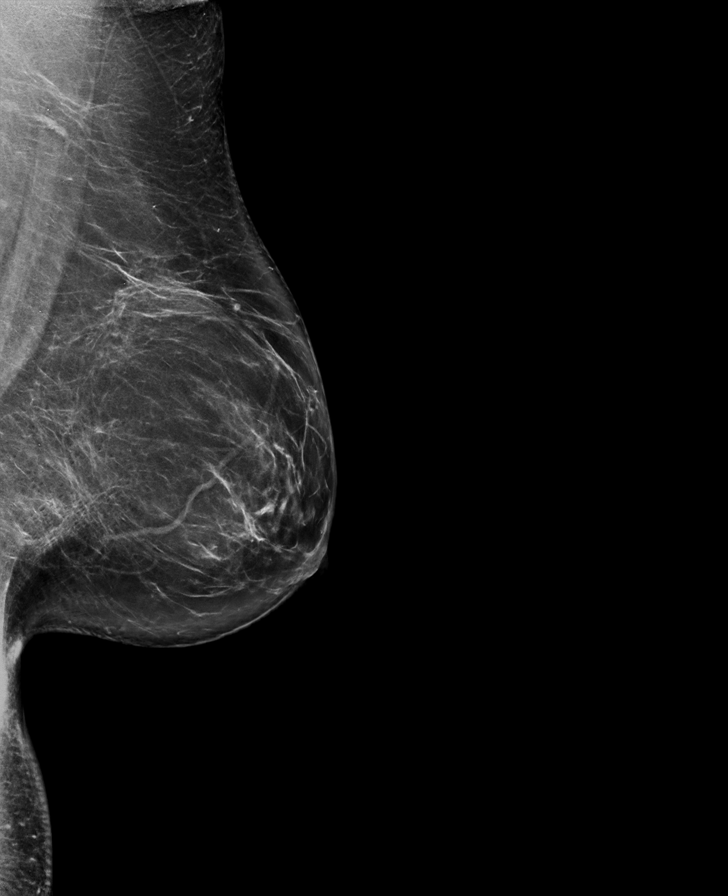

[R CC synth-2D]
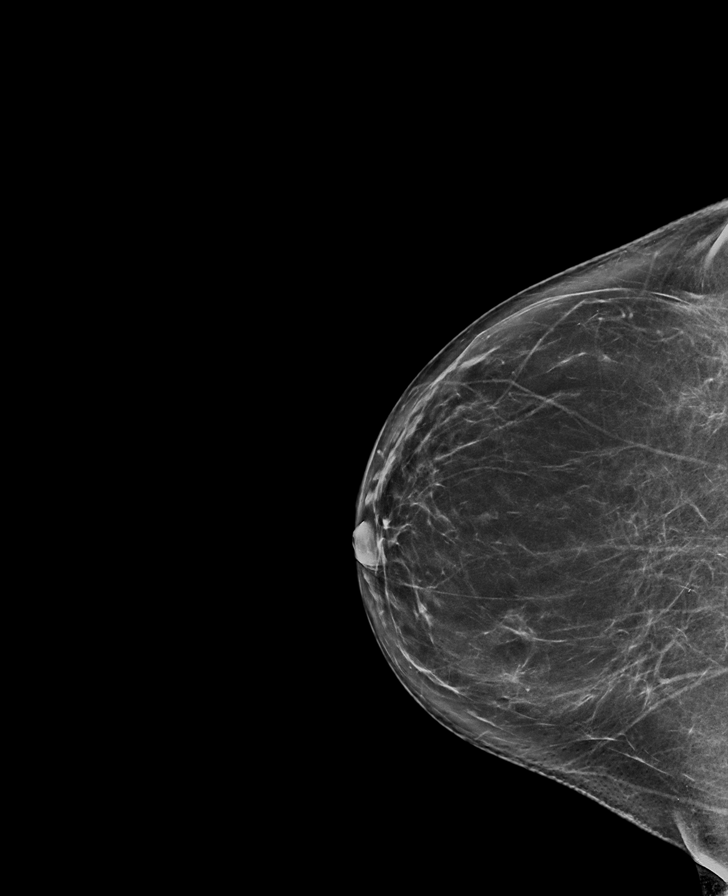

[R MLO synth-2D]
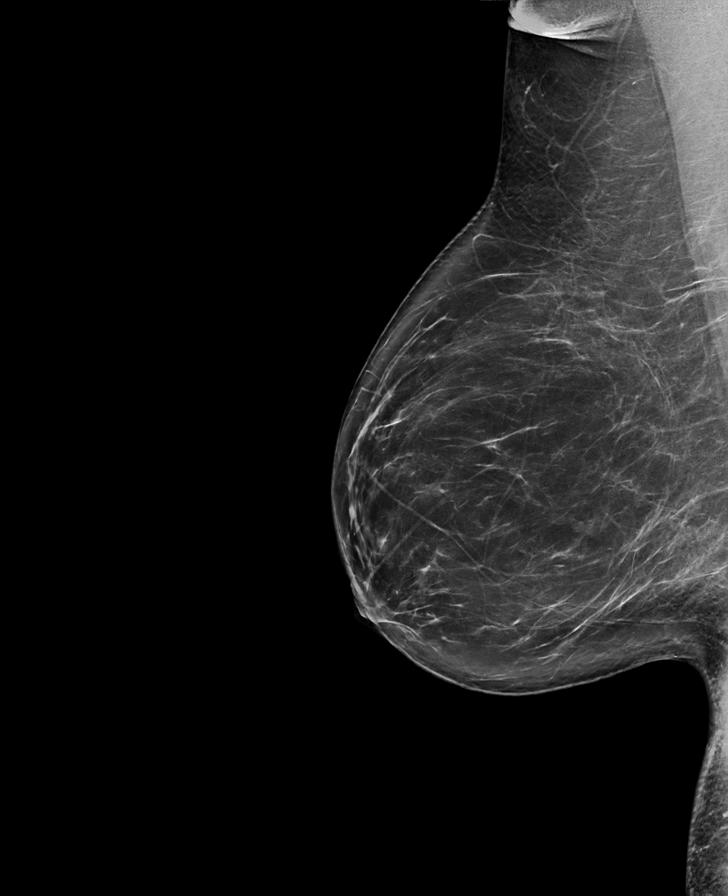

[R CC tomo · tomo slice 39/77.0]
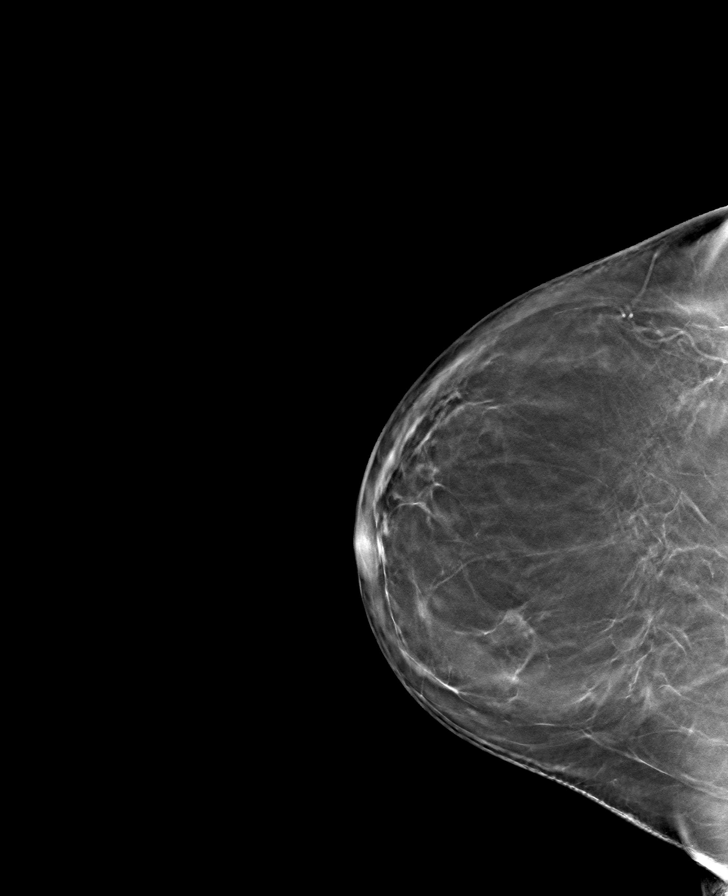

[L CC tomo · tomo slice 36/71.0]
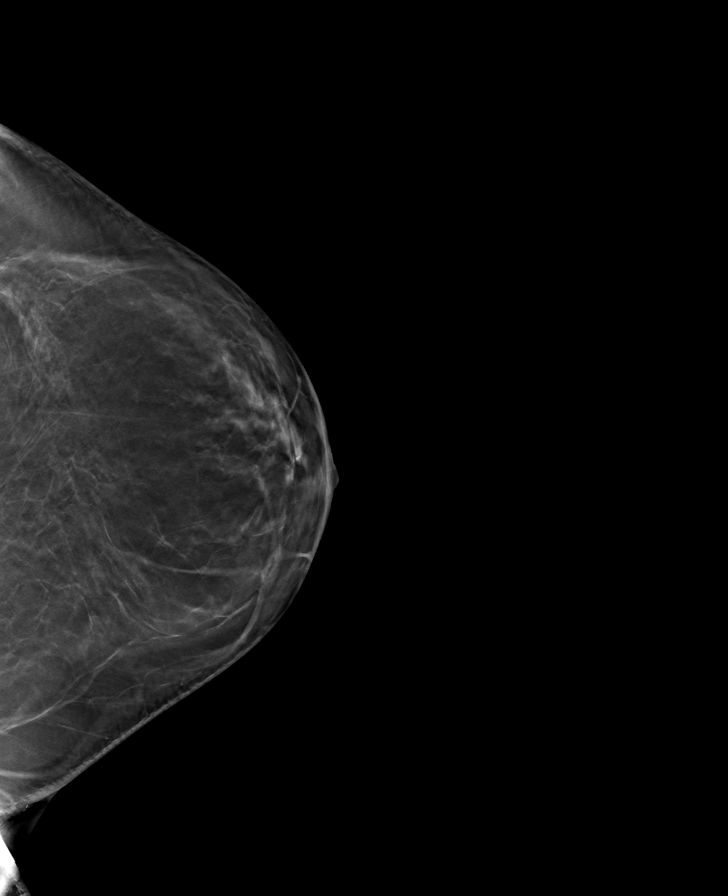

[L MLO tomo · tomo slice 43/86.0]
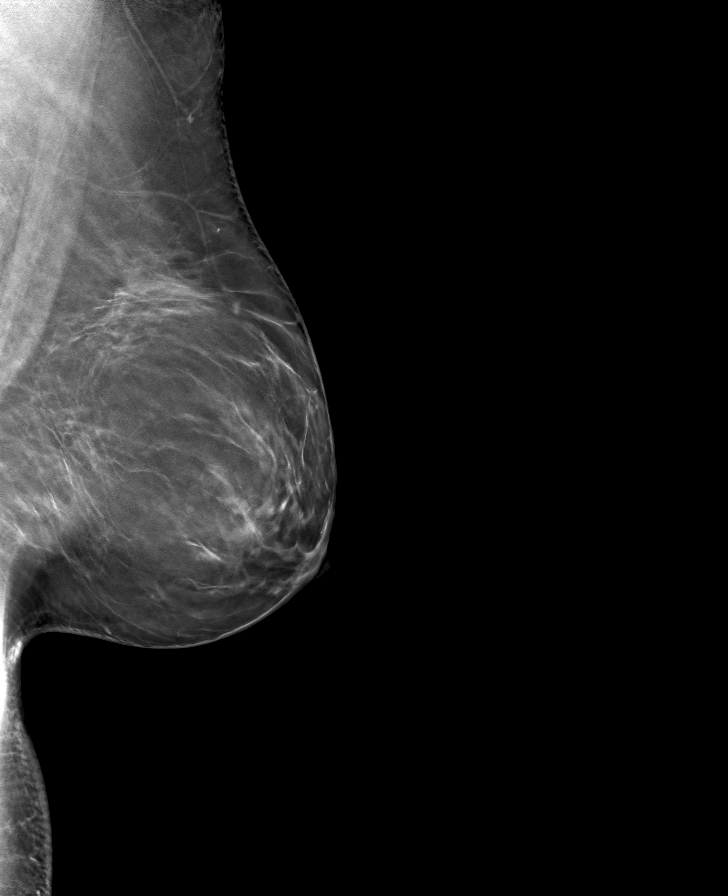

[R MLO tomo · tomo slice 44/87.0]
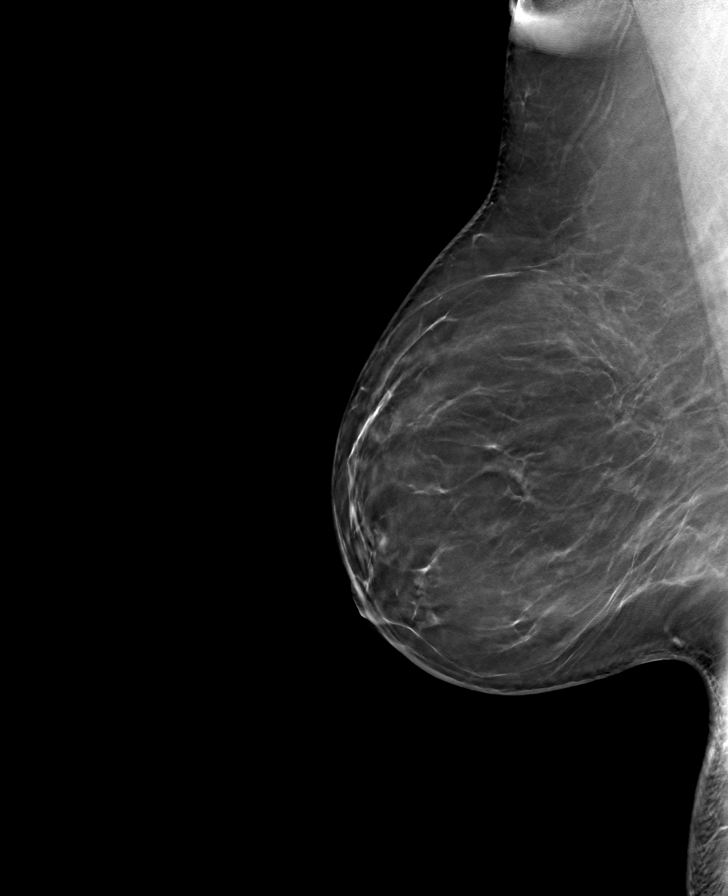

[8 of 24 positions shown; findings below may reference images not displayed]

ACR Breast Density Category b: There are scattered areas of
fibroglandular density.
FINDINGS: There are no findings suspicious for malignancy.
IMPRESSION: No mammographic evidence of malignancy. A result letter of this
screening mammogram will be mailed directly to the patient.

RECOMMENDATION:
Screening mammogram in one year. (Code:51-O-LD2)

BI-RADS CATEGORY  1: Negative.

## 2024-10-02 ENCOUNTER — Other Ambulatory Visit: Payer: Self-pay | Admitting: Internal Medicine

## 2024-10-02 DIAGNOSIS — Z1231 Encounter for screening mammogram for malignant neoplasm of breast: Secondary | ICD-10-CM

## 2024-10-28 ENCOUNTER — Encounter
